# Patient Record
Sex: Female | Born: 1975 | Race: White | Hispanic: No | Marital: Married | Smoking: Never smoker
Health system: Southern US, Community
[De-identification: ages and names within clinical notes are randomized; demographics above are authoritative.]

## PROBLEM LIST (undated history)

## (undated) DIAGNOSIS — E039 Hypothyroidism, unspecified: Secondary | ICD-10-CM

## (undated) DIAGNOSIS — M419 Scoliosis, unspecified: Secondary | ICD-10-CM

## (undated) DIAGNOSIS — F419 Anxiety disorder, unspecified: Secondary | ICD-10-CM

## (undated) HISTORY — DX: Hypothyroidism, unspecified: E03.9

## (undated) HISTORY — DX: Scoliosis, unspecified: M41.9

## (undated) HISTORY — DX: Anxiety disorder, unspecified: F41.9

---

## 1999-08-23 ENCOUNTER — Other Ambulatory Visit: Admission: RE | Admit: 1999-08-23 | Discharge: 1999-08-23 | Payer: Self-pay | Admitting: Gynecology

## 1999-08-23 ENCOUNTER — Encounter (INDEPENDENT_AMBULATORY_CARE_PROVIDER_SITE_OTHER): Payer: Self-pay | Admitting: Specialist

## 2001-09-03 ENCOUNTER — Other Ambulatory Visit: Admission: RE | Admit: 2001-09-03 | Discharge: 2001-09-03 | Payer: Self-pay | Admitting: Gynecology

## 2001-12-23 ENCOUNTER — Other Ambulatory Visit: Admission: RE | Admit: 2001-12-23 | Discharge: 2001-12-23 | Payer: Self-pay | Admitting: Gynecology

## 2002-08-13 ENCOUNTER — Other Ambulatory Visit: Admission: RE | Admit: 2002-08-13 | Discharge: 2002-08-13 | Payer: Self-pay | Admitting: Gynecology

## 2003-01-07 ENCOUNTER — Other Ambulatory Visit: Admission: RE | Admit: 2003-01-07 | Discharge: 2003-01-07 | Payer: Self-pay | Admitting: Gynecology

## 2003-09-01 ENCOUNTER — Other Ambulatory Visit: Admission: RE | Admit: 2003-09-01 | Discharge: 2003-09-01 | Payer: Self-pay | Admitting: Gynecology

## 2004-09-23 ENCOUNTER — Other Ambulatory Visit: Admission: RE | Admit: 2004-09-23 | Discharge: 2004-09-23 | Payer: Self-pay | Admitting: Gynecology

## 2005-10-23 ENCOUNTER — Other Ambulatory Visit: Admission: RE | Admit: 2005-10-23 | Discharge: 2005-10-23 | Payer: Self-pay | Admitting: Gynecology

## 2006-05-25 ENCOUNTER — Inpatient Hospital Stay (HOSPITAL_COMMUNITY): Admission: AD | Admit: 2006-05-25 | Discharge: 2006-05-25 | Payer: Self-pay | Admitting: Gynecology

## 2006-11-19 ENCOUNTER — Other Ambulatory Visit: Admission: RE | Admit: 2006-11-19 | Discharge: 2006-11-19 | Payer: Self-pay | Admitting: Gynecology

## 2007-04-22 ENCOUNTER — Ambulatory Visit (HOSPITAL_COMMUNITY): Admission: RE | Admit: 2007-04-22 | Discharge: 2007-04-22 | Payer: Self-pay | Admitting: Gynecology

## 2010-08-07 ENCOUNTER — Encounter: Payer: Self-pay | Admitting: Gynecology

## 2011-04-12 ENCOUNTER — Emergency Department (HOSPITAL_COMMUNITY)
Admission: EM | Admit: 2011-04-12 | Discharge: 2011-04-13 | Disposition: A | Discharge status: Home / Self Care | Payer: Managed Care, Other (non HMO) | Attending: Emergency Medicine | Admitting: Emergency Medicine

## 2011-04-12 DIAGNOSIS — IMO0002 Reserved for concepts with insufficient information to code with codable children: Secondary | ICD-10-CM | POA: Insufficient documentation

## 2011-04-12 DIAGNOSIS — H5710 Ocular pain, unspecified eye: Secondary | ICD-10-CM | POA: Insufficient documentation

## 2011-04-12 DIAGNOSIS — H11419 Vascular abnormalities of conjunctiva, unspecified eye: Secondary | ICD-10-CM | POA: Insufficient documentation

## 2011-04-12 DIAGNOSIS — Z79899 Other long term (current) drug therapy: Secondary | ICD-10-CM | POA: Insufficient documentation

## 2011-04-12 DIAGNOSIS — S058X9A Other injuries of unspecified eye and orbit, initial encounter: Secondary | ICD-10-CM | POA: Insufficient documentation

## 2014-01-27 ENCOUNTER — Other Ambulatory Visit: Payer: Self-pay

## 2014-01-27 DIAGNOSIS — Z1231 Encounter for screening mammogram for malignant neoplasm of breast: Secondary | ICD-10-CM

## 2014-02-06 ENCOUNTER — Ambulatory Visit: Payer: Managed Care, Other (non HMO)

## 2014-02-13 ENCOUNTER — Ambulatory Visit
Admission: RE | Admit: 2014-02-13 | Discharge: 2014-02-13 | Disposition: A | Discharge status: Home / Self Care | Payer: Managed Care, Other (non HMO) | Source: Ambulatory Visit

## 2014-02-13 ENCOUNTER — Encounter (INDEPENDENT_AMBULATORY_CARE_PROVIDER_SITE_OTHER): Payer: Self-pay

## 2014-02-13 DIAGNOSIS — Z1231 Encounter for screening mammogram for malignant neoplasm of breast: Secondary | ICD-10-CM

## 2014-03-06 ENCOUNTER — Other Ambulatory Visit: Payer: Self-pay | Admitting: Registered Nurse

## 2014-03-06 ENCOUNTER — Other Ambulatory Visit (HOSPITAL_COMMUNITY)
Admission: RE | Admit: 2014-03-06 | Discharge: 2014-03-06 | Disposition: A | Discharge status: Home / Self Care | Payer: Managed Care, Other (non HMO) | Source: Ambulatory Visit | Attending: Internal Medicine | Admitting: Internal Medicine

## 2014-03-06 DIAGNOSIS — Z01419 Encounter for gynecological examination (general) (routine) without abnormal findings: Secondary | ICD-10-CM | POA: Diagnosis not present

## 2014-03-12 LAB — CYTOLOGY - PAP

## 2014-05-25 ENCOUNTER — Telehealth: Payer: Self-pay | Admitting: Cardiology

## 2014-05-25 NOTE — Telephone Encounter (Signed)
[facility] (Dr Merri BrunetteWalter Pharr) for appointment with Dr Antoine PocheHochrein on 05/26/14.  [facility] for Dr Hochrein's schedule on 05/26/14 lp

## 2014-05-26 ENCOUNTER — Encounter: Payer: Self-pay | Admitting: Cardiology

## 2014-05-26 ENCOUNTER — Ambulatory Visit (INDEPENDENT_AMBULATORY_CARE_PROVIDER_SITE_OTHER): Payer: Managed Care, Other (non HMO) | Admitting: Cardiology

## 2014-05-26 VITALS — BP 104/66 | HR 61 | Ht 66.0 in | Wt 143.3 lb

## 2014-05-26 DIAGNOSIS — R002 Palpitations: Secondary | ICD-10-CM | POA: Insufficient documentation

## 2014-05-26 NOTE — Patient Instructions (Signed)
Follow up if needed

## 2014-05-26 NOTE — Progress Notes (Signed)
HPI The patient presents for evaluation of palpitations. She has no past cardiac history. She says she's been getting palpitations for about 3 weeks since she started on a brief course of steroids for hives. She denies any syncope or presyncope she feels some palpitations in her chest which she describes as isolated beats. She also describes something that feels like Octopus tentacles in her chest. These may have been daily or may go away for a while. She does not describe sustained tachycardia arrhythmias. These happen more at rest. She cannot bring them on with activity. She is active. She denies any chest pressure, neck or arm discomfort. She has no shortness of breath, PND or orthopnea. Anxiety and is currently to have therapy for management of this.  Allergies  Allergen Reactions  . Latuda [Lurasidone] Shortness Of Breath    Tingling in hands     Current Outpatient Prescriptions  Medication Sig Dispense Refill  . SYNTHROID 75 MCG tablet Take 75 mg by mouth daily.     No current facility-administered medications for this visit.    Past Medical History  Diagnosis Date  . Hypothyroid   . Anxiety     No past surgical history on file.  Family History  Problem Relation Age of Onset  . CAD Father 5870    7483  . Hypertension Mother     History   Social History  . Marital Status: Married    Spouse Name: N/A    Number of Children: 1  . Years of Education: N/A   Occupational History  . Not on file.   Social History Main Topics  . Smoking status: Never Smoker   . Smokeless tobacco: Not on file  . Alcohol Use: Not on file  . Drug Use: Not on file  . Sexual Activity: Not on file   Other Topics Concern  . Not on file   Social History Narrative   Lives at home with husband and son.    ROS:As stated in the HPI and negative for all other systems.  PHYSICAL EXAM BP 104/66 mmHg  Pulse 61  Ht 5\' 6"  (1.676 m)  Wt 143 lb 4.8 oz (65 kg)  BMI 23.14 kg/m2  GENERAL:  Well  appearing HEENT:  Pupils equal round and reactive, fundi not visualized, oral mucosa unremarkable NECK:  No jugular venous distention, waveform within normal limits, carotid upstroke brisk and symmetric, no bruits, no thyromegaly LYMPHATICS:  No cervical, inguinal adenopathy LUNGS:  Clear to auscultation bilaterally BACK:  No CVA tenderness CHEST:  Unremarkable HEART:  PMI not displaced or sustained,S1 and S2 within normal limits, no S3, no S4, no clicks, no rubs, no murmurs ABD:  Flat, positive bowel sounds normal in frequency in pitch, no bruits, no rebound, no guarding, no midline pulsatile mass, no hepatomegaly, no splenomegaly EXT:  2 plus pulses throughout, no edema, no cyanosis no clubbing SKIN:  No rashes no nodules NEURO:  Cranial nerves II through XII grossly intact, motor grossly intact throughout PSYCH:  Cognitively intact, oriented to person place and time  EKG:  Sinus rhythm, rate 61, axis within normal limits, intervals within normal limits, no acute ST-T wave changes. 05/26/2014   ASSESSMENT AND PLAN  PALPITATIONS:  She has a normal physical exam and EKG.  I do not suspect structural heart disease.  She is likely having PACs or PVCs.  We had a discussion about the nature of these. At this point she does not want medical therapy. We discussed beta  blockers or calcium channel blockers. She will consider these if she has any increasing symptoms. We did discuss possible contribution of caffeine although she says she has these before she wakes up in the morning and has her coffee.  Of note her thyroid profile was recently been normal.

## 2015-05-19 ENCOUNTER — Other Ambulatory Visit: Payer: Self-pay

## 2015-05-19 DIAGNOSIS — Z1231 Encounter for screening mammogram for malignant neoplasm of breast: Secondary | ICD-10-CM

## 2015-06-09 ENCOUNTER — Ambulatory Visit
Admission: RE | Admit: 2015-06-09 | Discharge: 2015-06-09 | Disposition: A | Discharge status: Home / Self Care | Payer: Managed Care, Other (non HMO) | Source: Ambulatory Visit

## 2015-06-09 DIAGNOSIS — Z1231 Encounter for screening mammogram for malignant neoplasm of breast: Secondary | ICD-10-CM

## 2016-07-28 ENCOUNTER — Other Ambulatory Visit: Payer: Self-pay | Admitting: Registered Nurse

## 2016-07-28 DIAGNOSIS — Z1231 Encounter for screening mammogram for malignant neoplasm of breast: Secondary | ICD-10-CM

## 2016-08-15 ENCOUNTER — Ambulatory Visit: Payer: Managed Care, Other (non HMO)

## 2016-08-25 ENCOUNTER — Ambulatory Visit
Admission: RE | Admit: 2016-08-25 | Discharge: 2016-08-25 | Disposition: A | Discharge status: Home / Self Care | Payer: Managed Care, Other (non HMO) | Source: Ambulatory Visit | Attending: Registered Nurse | Admitting: Registered Nurse

## 2016-08-25 DIAGNOSIS — Z1231 Encounter for screening mammogram for malignant neoplasm of breast: Secondary | ICD-10-CM

## 2017-11-26 ENCOUNTER — Other Ambulatory Visit: Payer: Self-pay | Admitting: Registered Nurse

## 2017-11-26 DIAGNOSIS — Z1231 Encounter for screening mammogram for malignant neoplasm of breast: Secondary | ICD-10-CM

## 2017-12-19 ENCOUNTER — Ambulatory Visit: Payer: Managed Care, Other (non HMO)

## 2017-12-26 ENCOUNTER — Ambulatory Visit
Admission: RE | Admit: 2017-12-26 | Discharge: 2017-12-26 | Disposition: A | Discharge status: Home / Self Care | Payer: Managed Care, Other (non HMO) | Source: Ambulatory Visit | Attending: Registered Nurse | Admitting: Registered Nurse

## 2017-12-26 DIAGNOSIS — Z1231 Encounter for screening mammogram for malignant neoplasm of breast: Secondary | ICD-10-CM

## 2018-05-02 DIAGNOSIS — F429 Obsessive-compulsive disorder, unspecified: Secondary | ICD-10-CM

## 2018-05-07 ENCOUNTER — Ambulatory Visit: Payer: Self-pay | Admitting: Psychiatry

## 2018-05-16 ENCOUNTER — Other Ambulatory Visit: Payer: Self-pay

## 2018-05-16 MED ORDER — DULOXETINE HCL 60 MG PO CPEP: 60.0000 mg | ORAL_CAPSULE | Freq: Every day | ORAL | 0 refills | Status: DC

## 2018-05-30 ENCOUNTER — Ambulatory Visit (INDEPENDENT_AMBULATORY_CARE_PROVIDER_SITE_OTHER): Payer: 59 | Admitting: Psychiatry

## 2018-05-30 DIAGNOSIS — F429 Obsessive-compulsive disorder, unspecified: Secondary | ICD-10-CM

## 2018-05-30 DIAGNOSIS — Z8659 Personal history of other mental and behavioral disorders: Secondary | ICD-10-CM

## 2018-05-30 DIAGNOSIS — F411 Generalized anxiety disorder: Secondary | ICD-10-CM

## 2018-05-30 NOTE — Progress Notes (Signed)
[facility]  Patient ID: Amanda Gilmore,  MRN: [id number]  PCP: Merri BrunettePharr, Walter, MD  Date of Evaluation: 05/30/2018 Time spent:20 minutes  Chief Complaint:   HISTORY/CURRENT STATUS: HPI 42 year old white female last seen in September of this year.  His diagnosis of anxiety, OCD.  Has past history of bipolar disorder and generalized anxiety disorder.  At her most recent visit her depression was about the same.  Complaining of problems with memory.  OCD is about the same.  Cymbalta was increased to 60 mg a day Currently doing well. No depression or manic sx. Still with OCD-takes pictures of things to prove to self that she did them.   Individual Medical History/ Review of Systems: Changes? :No   Allergies: Latuda [lurasidone]  Current Medications:  Current Outpatient Medications:  .  DULoxetine (CYMBALTA) 60 MG capsule, Take 1 capsule (60 mg total) by mouth daily., Disp: 30 capsule, Rfl: 0 .  SYNTHROID 75 MCG tablet, Take 75 mg by mouth daily., Disp: , Rfl:  Medication Side Effects: none  Family Medical/ Social History: Changes? No  MENTAL HEALTH EXAM:  There were no vitals taken for this visit.There is no height or weight on file to calculate BMI.  General Appearance: Casual  Eye Contact:  Good  Speech:  Normal Rate  Volume:  Normal  Mood:  Euthymic  Affect:  Appropriate  Thought Process:  Linear  Orientation:  Full (Time, Place, and Person)  Thought Content: Logical   Suicidal Thoughts:  No  Homicidal Thoughts:  No  Memory:  WNL  Judgement:  Good  Insight:  Good  Psychomotor Activity:  Normal  Concentration:  Concentration: Good  Recall:  Good  Fund of Knowledge: Good  Language: Good  Assets:  Social Support  ADL's:  Intact  Cognition: WNL  Prognosis:  Good    DIAGNOSES:    ICD-10-CM   1. Anxiety state F41.1   2. Obsessive-compulsive disorder, unspecified type F42.9   3. History of bipolar disorder Z86.59     Receiving Psychotherapy: No     RECOMMENDATIONS: No change continue Cymbalta 60 mg a day.  If her memory starts to bother her again she will contact her family physician.   Anne Fulay Agam Tuohy, PA-C

## 2018-05-31 ENCOUNTER — Other Ambulatory Visit: Payer: Self-pay

## 2018-05-31 MED ORDER — DULOXETINE HCL 60 MG PO CPEP: 60.0000 mg | ORAL_CAPSULE | Freq: Every day | ORAL | 0 refills | Status: DC

## 2018-08-26 ENCOUNTER — Ambulatory Visit: Payer: 59 | Admitting: Psychiatry

## 2018-09-03 ENCOUNTER — Ambulatory Visit: Payer: 59 | Admitting: Psychiatry

## 2019-01-03 ENCOUNTER — Telehealth: Payer: Self-pay

## 2019-01-03 NOTE — Telephone Encounter (Signed)
    COVID-19 Pre-Screening Questions:  . In the past 7 to 10 days have you had a cough,  shortness of breath, headache, congestion, fever (100 or greater) body aches, chills, sore throat, or sudden loss of taste or sense of smell? No . Have you been around anyone with known Covid 19. No . Have you been around anyone who is awaiting Covid 19 test results in the past 7 to 10 days? No . Have you been around anyone who has been exposed to Covid 19, or has mentioned symptoms of Covid 19 within the past 7 to 10 days? No  If you have any concerns/questions about symptoms patients report during screening (either on the phone or at threshold). Contact the provider seeing the patient or DOD for further guidance.  If neither are available contact a member of the leadership team.  Pt made aware to wear a mask and no visitors. Pt voiced understanding.

## 2019-01-07 ENCOUNTER — Encounter: Payer: Self-pay | Admitting: Cardiology

## 2019-01-07 ENCOUNTER — Other Ambulatory Visit: Payer: Self-pay

## 2019-01-07 ENCOUNTER — Ambulatory Visit (INDEPENDENT_AMBULATORY_CARE_PROVIDER_SITE_OTHER): Payer: Managed Care, Other (non HMO) | Admitting: Cardiology

## 2019-01-07 VITALS — BP 109/77 | HR 70 | Ht 67.0 in | Wt 177.6 lb

## 2019-01-07 DIAGNOSIS — Z8249 Family history of ischemic heart disease and other diseases of the circulatory system: Secondary | ICD-10-CM

## 2019-01-07 DIAGNOSIS — R002 Palpitations: Secondary | ICD-10-CM

## 2019-01-07 DIAGNOSIS — Z7189 Other specified counseling: Secondary | ICD-10-CM | POA: Diagnosis not present

## 2019-01-07 NOTE — Patient Instructions (Addendum)

## 2019-01-07 NOTE — Progress Notes (Signed)
Cardiology Office Note:    Date:  01/07/2019   ID:  Amanda Gilmore, DOB 1975/12/13, MRN [id number]  PCP:  Deland Pretty, MD  Cardiologist:  Buford Dresser, MD PhD (previously Dr. Percival Spanish in 2015)  Referring MD: Deland Pretty, MD   CC: palpitations  History of Present Illness:    Amanda Gilmore is a 43 y.o. female with a hx of palpitations who is seen as a new consult at the request of Deland Pretty, MD for the evaluation and management of palpitations.  Tachycardia/palpitations: -Initial onset: first noticed about 10 years ago, has been intermittent but for 3 days starting 12/26/18 felt like it was constant. She was doing work outside when it started on a Friday, and it continued throughout the weekend. It has since stopped. She saw Dr. Shelia Media several days after, notes reviewed.  -Frequency/Duration: comes and goes, most recent episode lasted constantly for 3 days. Was doing yard work/mulching at the time. Lasted all day and all night. Has every few months, not predictable. -Associated symptoms: no chest pain, shortness of breath, nausea, and diaphoresis. Felt like heart was flip-flopping like a fish, felt it beating in her throat. Denies chest pain to me. -Aggravating/alleviating factors: not better with rest, possibly triggered by yard work -Syncope/near syncope: none -Prior cardiac history: none -Prior ECG: NSR -Prior workup: none -Prior treatment: none -Possible medication interactions: on cymbalta and synthroid. No recent med dose changes -Caffeine: 1-2 cups of coffee/day (less now that she works from home) -Alcohol: 2 glasses of wine/week -Tobacco: none -OTC supplements: occ multivitamin, no others -Comorbidities: thyroid disease (has been stable), anxiety (well controlled) -Exercise level: not as active as she wants to be, has gained 40 lbs in 4 years. -Labs: TSH, kidney function/electrolytes, CBC reviewed. -Cardiac ROS: no chest pain, no shortness of breath, no PND, no  orthopnea, no LE edema. -Family history: Pat grandfather and father died of MI (dad was 43). Mat gpa had MI. No clear strokes. Mother had an "episode" many years ago, unclear what it was, ?stroke.  Past Medical History:  Diagnosis Date  . Anxiety   . Hypothyroid     History reviewed. No pertinent surgical history.  Current Medications: Current Outpatient Medications on File Prior to Visit  Medication Sig  . DULoxetine (CYMBALTA) 60 MG capsule Take 1 capsule (60 mg total) by mouth daily.  Marland Kitchen SYNTHROID 75 MCG tablet Take 75 mg by mouth daily.   No current facility-administered medications on file prior to visit.      Allergies:   Latuda [lurasidone]   Social History   Socioeconomic History  . Marital status: Married    Spouse name: Not on file  . Number of children: 1  . Years of education: Not on file  . Highest education level: Not on file  Occupational History  . Not on file  Social Needs  . Financial resource strain: Not on file  . Food insecurity    Worry: Not on file    Inability: Not on file  . Transportation needs    Medical: Not on file    Non-medical: Not on file  Tobacco Use  . Smoking status: Never Smoker  Substance and Sexual Activity  . Alcohol use: Not on file  . Drug use: Not on file  . Sexual activity: Not on file  Lifestyle  . Physical activity    Days per week: Not on file    Minutes per session: Not on file  . Stress: Not on file  Relationships  . Social Musicianconnections    Talks on phone: Not on file    Gets together: Not on file    Attends religious service: Not on file    Active member of club or organization: Not on file    Attends meetings of clubs or organizations: Not on file    Relationship status: Not on file  Other Topics Concern  . Not on file  Social History Narrative   Lives at home with husband and son.     Family History: The patient's family history includes CAD (age of onset: 7270) in her father; Hypertension in her mother.   ROS:   Please see the history of present illness.  Additional pertinent ROS:  Constitutional: Negative for chills, fever, night sweats, unintentional weight loss  HENT: Negative for ear pain and hearing loss.   Eyes: Negative for loss of vision and eye pain.  Respiratory: Negative for cough, sputum, shortness of breath, wheezing.   Cardiovascular: See HPI. Gastrointestinal: Negative for abdominal pain, melena, and hematochezia.  Genitourinary: Negative for dysuria and hematuria.  Musculoskeletal: Negative for falls and myalgias.  Skin: Negative for itching and rash.  Neurological: Negative for focal weakness, focal sensory changes and loss of consciousness.  Endo/Heme/Allergies: Does not bruise/bleed easily.   EKGs/Labs/Other Studies Reviewed:    The following studies were reviewed today: Notes from Dr. Carolee RotaPharr's office  EKG:  EKG is personally reviewed.  The ekg ordered today demonstrates NSR  Recent Labs: No results found for requested labs within last 8760 hours.  Recent Lipid Panel No results found for: CHOL, TRIG, HDL, CHOLHDL, VLDL, LDLCALC, LDLDIRECT  Physical Exam:    VS:  BP 109/77   Pulse 70   Ht 5\' 7"  (1.702 m)   Wt 177 lb 9.6 oz (80.6 kg)   SpO2 97%   BMI 27.82 kg/m     Wt Readings from Last 3 Encounters:  01/07/19 177 lb 9.6 oz (80.6 kg)  05/26/14 143 lb 4.8 oz (65 kg)     GEN: Well nourished, well developed in no acute distress HEENT: Normal NECK: No JVD; No carotid bruits LYMPHATICS: No lymphadenopathy CARDIAC: regular rhythm, normal S1 and S2, no murmurs, rubs, gallops. Radial and DP pulses 2+ bilaterally. RESPIRATORY:  Clear to auscultation without rales, wheezing or rhonchi  ABDOMEN: Soft, non-tender, non-distended MUSCULOSKELETAL:  No edema; No deformity  SKIN: Warm and dry NEUROLOGIC:  Alert and oriented x 3 PSYCHIATRIC:  Normal affect   ASSESSMENT:    1. Heart palpitations   2. Family history of heart disease   3. Cardiac risk counseling     PLAN:    Palpitations: discussed potential etiologies at length, focused on arrhythmias/conduction issues. Also discussed potential role of structural and coronary disease, though her symptoms are not typical for this.  -given the infrequency of her symptoms and the unclear triggers, it may be difficult to catch an episode with a temporary monitor. She has no high risk features warranting an implantable device -if she has another sustained episode, she will call the office and we will bring her in for a nurse visit for an ECG -if she has several short events but increasing frequency, we will order an event monitor to evaluate her rhythm. -if she does not have frequent or long lasting events, it may be difficult to capture. -we will follow up in 1 year if her symptoms do not progress, or I would be happy to see her sooner with any other concerns.  Cardiac  risk counseling and prevention recommendations: At follow up, would like to discuss prevention in more depth given her family history of heart disease. -recommend heart healthy/Mediterranean diet, with whole grains, fruits, vegetable, fish, lean meats, nuts, and olive oil. Limit salt. -recommend moderate walking, 3-5 times/week for 30-50 minutes each session. Aim for at least 150 minutes.week. Goal should be pace of 3 miles/hours, or walking 1.5 miles in 30 minutes -recommend avoidance of tobacco products. Avoid excess alcohol. -ASCVD risk score: The 10-year ASCVD risk score Denman George([place]off [place] Montez HagemanJr., et al., 2013) is: 0.3%   Values used to calculate the score:     Age: [age] years     Sex: Female     Is Non-Hispanic African American: No     Diabetic: No     Tobacco smoker: No     Systolic Blood Pressure: 109 mmHg     Is BP treated: No     HDL Cholesterol: 62 mg/dL     Total Cholesterol: 143 mg/dL    Plan for follow up: 1 year or sooner PRN based on her symptoms  Medication Adjustments/Labs and Tests Ordered: Current medicines are reviewed at  length with the patient today.  Concerns regarding medicines are outlined above.  Orders Placed This Encounter  Procedures  . EKG 12-Lead   No orders of the defined types were placed in this encounter.   Patient Instructions  Medication Instructions:  Your Physician recommend you continue on your current medication as directed.    If you need a refill on your cardiac medications before your next appointment, please call your pharmacy.   Lab work: None  Testing/Procedures: None  Follow-Up: At [facility], you and your health needs are our priority.  As part of our continuing mission to provide you with exceptional heart care, we have created designated Provider Care Teams.  These Care Teams include your primary Cardiologist (physician) and Advanced Practice Providers (APPs -  Physician Assistants and Nurse Practitioners) who all work together to provide you with the care you need, when you need it. You will need a follow up appointment in 1 years.  Please call our office 2 months in advance to schedule this appointment.  You may see Dr. Cristal Deerhristopher or one of the following Advanced Practice Providers on your designated Care Team:   Theodore DemarkRhonda Barrett, PA-C . Joni ReiningKathryn Lawrence, DNP, ANP        Signed, Jodelle RedBridgette Nguyen Butler, MD PhD 01/07/2019 2:50 PM    [facility]

## 2019-03-04 ENCOUNTER — Other Ambulatory Visit: Payer: Self-pay

## 2019-03-04 ENCOUNTER — Ambulatory Visit (INDEPENDENT_AMBULATORY_CARE_PROVIDER_SITE_OTHER): Payer: 59 | Admitting: Psychiatry

## 2019-03-04 ENCOUNTER — Encounter

## 2019-03-04 ENCOUNTER — Encounter: Payer: Self-pay | Admitting: Psychiatry

## 2019-03-04 VITALS — Ht 67.0 in | Wt 173.0 lb

## 2019-03-04 DIAGNOSIS — F411 Generalized anxiety disorder: Secondary | ICD-10-CM | POA: Diagnosis not present

## 2019-03-04 MED ORDER — BUSPIRONE HCL 15 MG PO TABS: ORAL_TABLET | ORAL | 1 refills | Status: DC

## 2019-03-04 MED ORDER — DULOXETINE HCL 20 MG PO CSDR: 40.0000 mg | DELAYED_RELEASE_CAPSULE | Freq: Every morning | ORAL | 1 refills | Status: DC

## 2019-03-04 NOTE — Progress Notes (Signed)
Amanda Gilmore [id number] Aug 19, 1975 43 y.o.  Subjective:   Patient ID:  Amanda MarkMarla M Gilmore is a 43 y.o. (DOB Aug 19, 1975) female.  Chief Complaint:  Chief Complaint  Patient presents with  . Anxiety    HPI Amanda Gilmore presents to the office today for follow-up of anxiety.  Patient was previously seen by Anne Fulay Shugart, PA.  She reports that she feels "flat" with current medication and is interested in making a change in medication. Pt reports increased anxiety in response to pandemic. Notices increased worry and rumination. Denies panic attacks. Denies any physical s/s with anxiety. Had episode of heart palpitations a few months ago and both she and cardiologist did not think it palpitations were related to anxiety.   Denies persistent depressed mood. Occ situational sadness. She reports that she is getting less sleep and has difficulty falling asleep. Estimates sleeping 5 hours a night. She reports that appetite has been good. Reports some weight gain with pandemic. Energy and motivation have been lower. Notices procrastination at work. She reports poor concentration and is more easily distracted. Denies anhedonia. Denies SI.   Mother lives with her. Son is going into 4th grade. Has been working from home and staying mostly at home. 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   Reports that SNRI's seem to have been better tolerated for her.   Past Psychiatric Medication Trials: Latuda- Adverse reaction- tingling in hands and SOB Cymbalta- helped with anxiety. "Flat" feeling/affective dulling Zoloft- heart palpitations Paxil Pristiq Viibryd Trintellix Lamictal   Review of Systems:  Review of Systems  Constitutional: Positive for unexpected weight change.  Cardiovascular: Negative for palpitations.  Musculoskeletal: Negative for gait problem.  Neurological: Negative for tremors.  Psychiatric/Behavioral:       Please refer to HPI    Medications: I have reviewed the patient's current  medications.  Current Outpatient Medications  Medication Sig Dispense Refill  . levothyroxine (SYNTHROID) 88 MCG tablet Take 88 mg by mouth daily.     . Multiple Vitamin (MULTIVITAMIN) tablet Take 1 tablet by mouth daily.    . Probiotic Product (PROBIOTIC PO) Take by mouth.    . busPIRone (BUSPAR) 15 MG tablet Take 1/3 tablet p.o. twice daily for 1 week, then take 2/3 tablet p.o. twice daily for 1 week, then take 1 tablet p.o. twice daily 60 tablet 1  . DULoxetine HCl 20 MG CSDR Take 40 mg by mouth every morning. 30 capsule 1   No current facility-administered medications for this visit.     Medication Side Effects: None  Allergies:  Allergies  Allergen Reactions  . Latuda [Lurasidone] Shortness Of Breath    Tingling in hands     Past Medical History:  Diagnosis Date  . Anxiety   . Hypothyroid     Family History  Problem Relation Age of Onset  . CAD Father 3970       6683  . Hypertension Mother     Social History   Socioeconomic History  . Marital status: Married    Spouse name: Not on file  . Number of children: 1  . Years of education: Not on file  . Highest education level: Not on file  Occupational History  . Not on file  Social Needs  . Financial resource strain: Not on file  . Food insecurity    Worry: Not on file    Inability: Not on file  . Transportation needs    Medical: Not on file    Non-medical: Not  on file  Tobacco Use  . Smoking status: Never Smoker  . Smokeless tobacco: Never Used  Substance and Sexual Activity  . Alcohol use: Not on file  . Drug use: Not on file  . Sexual activity: Not on file  Lifestyle  . Physical activity    Days per week: Not on file    Minutes per session: Not on file  . Stress: Not on file  Relationships  . Social Herbalist on phone: Not on file    Gets together: Not on file    Attends religious service: Not on file    Active member of club or organization: Not on file    Attends meetings of clubs  or organizations: Not on file    Relationship status: Not on file  . Intimate partner violence    Fear of current or ex partner: Not on file    Emotionally abused: Not on file    Physically abused: Not on file    Forced sexual activity: Not on file  Other Topics Concern  . Not on file  Social History Narrative   Lives at home with husband and son.    Past Medical History, Surgical history, Social history, and Family history were reviewed and updated as appropriate.   Please see review of systems for further details on the patient's review from today.   Objective:   Physical Exam:  Ht 5\' 7"  (1.702 m)   Wt 173 lb (78.5 kg)   BMI 27.10 kg/m   Physical Exam Constitutional:      General: She is not in acute distress.    Appearance: She is well-developed.  Musculoskeletal:        General: No deformity.  Neurological:     Mental Status: She is alert and oriented to person, place, and time.     Coordination: Coordination normal.  Psychiatric:        Attention and Perception: Attention and perception normal. She does not perceive auditory or visual hallucinations.        Mood and Affect: Mood is anxious. Mood is not depressed. Affect is not labile, blunt, angry or inappropriate.        Speech: Speech normal.        Behavior: Behavior normal.        Thought Content: Thought content normal. Thought content does not include homicidal or suicidal ideation. Thought content does not include homicidal or suicidal plan.        Cognition and Memory: Cognition and memory normal.        Judgment: Judgment normal.     Comments: Insight intact. No delusions.      Lab Review:  No results found for: NA, K, CL, CO2, GLUCOSE, BUN, CREATININE, CALCIUM, PROT, ALBUMIN, AST, ALT, ALKPHOS, BILITOT, GFRNONAA, GFRAA  No results found for: WBC, RBC, HGB, HCT, PLT, MCV, MCH, MCHC, RDW, LYMPHSABS, MONOABS, EOSABS, BASOSABS  No results found for: POCLITH, LITHIUM   No results found for: PHENYTOIN,  PHENOBARB, VALPROATE, CBMZ   .res Assessment: Plan:   Discussed potential benefits, risks, and side effects of BuSpar for anxiety.  Patient agrees to trial of BuSpar. Discussed option of either continuing Cymbalta 60 mg daily while starting BuSpar or reducing dose of Cymbalta.  Patient reports that she would prefer to start reducing dose of Cymbalta.  She reports that she has had some significant discontinuation if she is late or misses a dose of Cymbalta.  Discussed therefore decreasing Cymbalta to  40 mg daily until patient returns to office to minimize discontinuation signs and symptoms and also to determine if affect of dulling improves with lower dose. Patient to follow-up with this provider in 6 weeks or sooner if clinically indicated. Patient advised to contact office with any questions, adverse effects, or acute worsening in signs and symptoms.  Amanda Gilmore was seen today for anxiety.  Diagnoses and all orders for this visit:  Generalized anxiety disorder -     DULoxetine HCl 20 MG CSDR; Take 40 mg by mouth every morning. -     busPIRone (BUSPAR) 15 MG tablet; Take 1/3 tablet p.o. twice daily for 1 week, then take 2/3 tablet p.o. twice daily for 1 week, then take 1 tablet p.o. twice daily     Please see After Visit Summary for patient specific instructions.  Future Appointments  Date Time Provider Department Center  04/15/2019  8:30 AM Corie Chiquitoarter, Raevin Wierenga, PMHNP [facility]-CP None    No orders of the defined types were placed in this encounter.   -------------------------------

## 2019-03-04 NOTE — Patient Instructions (Signed)
-  Decrease Cymbalta to 40 mg daily. (Likely pharmacy will dispense 20 mg capsule and take 2 of the 20 mg capsules in the morning.  -Start BuSpar 15 mg 1/3 tablet twice daily for 1 week, then increase to 2/3 tablet twice daily for 1 week, then increase to 1 tablet twice daily for anxiety.  Call with any questions or issues.

## 2019-03-17 ENCOUNTER — Other Ambulatory Visit: Payer: Self-pay | Admitting: Psychiatry

## 2019-03-17 DIAGNOSIS — F411 Generalized anxiety disorder: Secondary | ICD-10-CM

## 2019-04-01 ENCOUNTER — Other Ambulatory Visit: Payer: Self-pay | Admitting: Psychiatry

## 2019-04-01 DIAGNOSIS — F411 Generalized anxiety disorder: Secondary | ICD-10-CM

## 2019-04-15 ENCOUNTER — Ambulatory Visit (INDEPENDENT_AMBULATORY_CARE_PROVIDER_SITE_OTHER): Payer: 59 | Admitting: Psychiatry

## 2019-04-15 ENCOUNTER — Other Ambulatory Visit: Payer: Self-pay

## 2019-04-15 ENCOUNTER — Encounter: Payer: Self-pay | Admitting: Psychiatry

## 2019-04-15 DIAGNOSIS — F32A Depression, unspecified: Secondary | ICD-10-CM

## 2019-04-15 DIAGNOSIS — F411 Generalized anxiety disorder: Secondary | ICD-10-CM

## 2019-04-15 DIAGNOSIS — F329 Major depressive disorder, single episode, unspecified: Secondary | ICD-10-CM | POA: Diagnosis not present
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
## 2019-05-02 ENCOUNTER — Observation Stay (HOSPITAL_COMMUNITY): Payer: Managed Care, Other (non HMO) | Admitting: Registered Nurse

## 2019-05-02 ENCOUNTER — Other Ambulatory Visit: Payer: Self-pay | Admitting: Internal Medicine

## 2019-05-02 ENCOUNTER — Encounter (HOSPITAL_COMMUNITY): Payer: Self-pay | Admitting: Emergency Medicine

## 2019-05-02 ENCOUNTER — Ambulatory Visit
Admission: RE | Admit: 2019-05-02 | Discharge: 2019-05-02 | Disposition: A | Discharge status: Home / Self Care | Payer: Managed Care, Other (non HMO) | Source: Ambulatory Visit | Attending: Internal Medicine | Admitting: Internal Medicine

## 2019-05-02 ENCOUNTER — Encounter (HOSPITAL_COMMUNITY)
Admission: EM | Disposition: A | Discharge status: Home / Self Care | Payer: Self-pay | Source: Home / Self Care | Attending: Emergency Medicine

## 2019-05-02 ENCOUNTER — Observation Stay (HOSPITAL_COMMUNITY)
Admission: EM | Admit: 2019-05-02 | Discharge: 2019-05-03 | Disposition: A | Discharge status: Home / Self Care | Payer: Managed Care, Other (non HMO) | Attending: General Surgery | Admitting: General Surgery

## 2019-05-02 ENCOUNTER — Other Ambulatory Visit: Payer: Self-pay

## 2019-05-02 DIAGNOSIS — Z79890 Hormone replacement therapy (postmenopausal): Secondary | ICD-10-CM | POA: Insufficient documentation

## 2019-05-02 DIAGNOSIS — Z79899 Other long term (current) drug therapy: Secondary | ICD-10-CM | POA: Diagnosis not present

## 2019-05-02 DIAGNOSIS — Z8249 Family history of ischemic heart disease and other diseases of the circulatory system: Secondary | ICD-10-CM | POA: Diagnosis not present

## 2019-05-02 DIAGNOSIS — R1031 Right lower quadrant pain: Secondary | ICD-10-CM

## 2019-05-02 DIAGNOSIS — N809 Endometriosis, unspecified: Secondary | ICD-10-CM | POA: Insufficient documentation

## 2019-05-02 DIAGNOSIS — Z9049 Acquired absence of other specified parts of digestive tract: Secondary | ICD-10-CM | POA: Diagnosis present

## 2019-05-02 DIAGNOSIS — E039 Hypothyroidism, unspecified: Secondary | ICD-10-CM | POA: Insufficient documentation

## 2019-05-02 DIAGNOSIS — F429 Obsessive-compulsive disorder, unspecified: Secondary | ICD-10-CM | POA: Insufficient documentation

## 2019-05-02 DIAGNOSIS — F419 Anxiety disorder, unspecified: Secondary | ICD-10-CM | POA: Insufficient documentation

## 2019-05-02 DIAGNOSIS — K3580 Unspecified acute appendicitis: Principal | ICD-10-CM | POA: Diagnosis present

## 2019-05-02 DIAGNOSIS — Z20828 Contact with and (suspected) exposure to other viral communicable diseases: Secondary | ICD-10-CM | POA: Insufficient documentation

## 2019-05-02 HISTORY — PX: LAPAROSCOPIC APPENDECTOMY: SHX408

## 2019-05-02 LAB — URINALYSIS, ROUTINE W REFLEX MICROSCOPIC
Bilirubin Urine: NEGATIVE
Glucose, UA: NEGATIVE mg/dL
Hgb urine dipstick: NEGATIVE
Ketones, ur: 20 mg/dL — AB
Leukocytes,Ua: NEGATIVE
Nitrite: NEGATIVE
Protein, ur: NEGATIVE mg/dL
Specific Gravity, Urine: 1.004 — ABNORMAL LOW (ref 1.005–1.030)
pH: 6 (ref 5.0–8.0)

## 2019-05-02 LAB — COMPREHENSIVE METABOLIC PANEL
ALT: 26 U/L (ref 0–44)
AST: 21 U/L (ref 15–41)
Albumin: 4.4 g/dL (ref 3.5–5.0)
Alkaline Phosphatase: 34 U/L — ABNORMAL LOW (ref 38–126)
Anion gap: 12 (ref 5–15)
BUN: 7 mg/dL (ref 6–20)
CO2: 23 mmol/L (ref 22–32)
Calcium: 9.3 mg/dL (ref 8.9–10.3)
Chloride: 102 mmol/L (ref 98–111)
Creatinine, Ser: 0.8 mg/dL (ref 0.44–1.00)
GFR calc Af Amer: >60 mL/min (ref >60)
GFR calc non Af Amer: >60 mL/min (ref >60)
Glucose, Bld: 97 mg/dL (ref 70–99)
Potassium: 4.6 mmol/L (ref 3.5–5.1)
Sodium: 137 mmol/L (ref 135–145)
Total Bilirubin: 0.8 mg/dL (ref 0.3–1.2)
Total Protein: 7.5 g/dL (ref 6.5–8.1)

## 2019-05-02 LAB — CBC
HCT: 44.5 % (ref 36.0–46.0)
Hemoglobin: 14.8 g/dL (ref 12.0–15.0)
MCH: 28.8 pg (ref 26.0–34.0)
MCHC: 33.3 g/dL (ref 30.0–36.0)
MCV: 86.7 fL (ref 80.0–100.0)
Platelets: 278 10*3/uL (ref 150–400)
RBC: 5.13 MIL/uL — ABNORMAL HIGH (ref 3.87–5.11)
RDW: 12.8 % (ref 11.5–15.5)
WBC: 14 10*3/uL — ABNORMAL HIGH (ref 4.0–10.5)
nRBC: 0 % (ref 0.0–0.2)

## 2019-05-02 LAB — I-STAT BETA HCG BLOOD, ED (MC, WL, AP ONLY): I-stat hCG, quantitative: <5 m[IU]/mL (ref <5)

## 2019-05-02 LAB — LIPASE, BLOOD: Lipase: 21 U/L (ref 11–51)

## 2019-05-02 LAB — SARS CORONAVIRUS 2 BY RT PCR (HOSPITAL ORDER, PERFORMED IN ~~LOC~~ HOSPITAL LAB): SARS Coronavirus 2: NEGATIVE

## 2019-05-02 SURGERY — APPENDECTOMY, LAPAROSCOPIC
Anesthesia: General | Site: Abdomen

## 2019-05-02 MED ORDER — PHENYLEPHRINE 40 MCG/ML (10ML) SYRINGE FOR IV PUSH (FOR BLOOD PRESSURE SUPPORT)
PREFILLED_SYRINGE | INTRAVENOUS | Status: AC
  Filled 2019-05-02: qty 10

## 2019-05-02 MED ORDER — 0.9 % SODIUM CHLORIDE (POUR BTL) OPTIME
TOPICAL | Status: DC | PRN
  Administered 2019-05-02: 1000 mL

## 2019-05-02 MED ORDER — DIPHENHYDRAMINE HCL 50 MG/ML IJ SOLN: 12.5000 mg | Freq: Once | INTRAMUSCULAR | Status: DC

## 2019-05-02 MED ORDER — KETOROLAC TROMETHAMINE 30 MG/ML IJ SOLN
INTRAMUSCULAR | Status: AC
  Filled 2019-05-02: qty 1

## 2019-05-02 MED ORDER — KETOROLAC TROMETHAMINE 15 MG/ML IJ SOLN
INTRAMUSCULAR | Status: DC | PRN
  Administered 2019-05-02: 15 mg via INTRAVENOUS

## 2019-05-02 MED ORDER — ROCURONIUM BROMIDE 10 MG/ML (PF) SYRINGE
PREFILLED_SYRINGE | INTRAVENOUS | Status: AC
  Filled 2019-05-02: qty 10

## 2019-05-02 MED ORDER — LIDOCAINE 2% (20 MG/ML) 5 ML SYRINGE
INTRAMUSCULAR | Status: DC | PRN
  Administered 2019-05-02: 100 mg via INTRAVENOUS

## 2019-05-02 MED ORDER — DEXAMETHASONE SODIUM PHOSPHATE 10 MG/ML IJ SOLN
INTRAMUSCULAR | Status: AC
  Filled 2019-05-02: qty 1

## 2019-05-02 MED ORDER — PROPOFOL 10 MG/ML IV BOLUS
INTRAVENOUS | Status: AC
  Filled 2019-05-02: qty 20

## 2019-05-02 MED ORDER — FENTANYL CITRATE (PF) 250 MCG/5ML IJ SOLN
INTRAMUSCULAR | Status: DC | PRN
  Administered 2019-05-02: 50 ug via INTRAVENOUS
  Administered 2019-05-02: 100 ug via INTRAVENOUS
  Administered 2019-05-02 (×2): 50 ug via INTRAVENOUS

## 2019-05-02 MED ORDER — SODIUM CHLORIDE 0.9 % IV SOLN
2.0000 g | INTRAVENOUS | Status: DC
  Administered 2019-05-02: 2 g via INTRAVENOUS
  Filled 2019-05-02: qty 20

## 2019-05-02 MED ORDER — LACTATED RINGERS IV BOLUS
500.0000 mL | Freq: Once | INTRAVENOUS | Status: AC
  Administered 2019-05-02: 500 mL via INTRAVENOUS

## 2019-05-02 MED ORDER — MIDAZOLAM HCL 2 MG/2ML IJ SOLN
INTRAMUSCULAR | Status: AC
  Filled 2019-05-02: qty 2

## 2019-05-02 MED ORDER — LACTATED RINGERS IV SOLN
INTRAVENOUS | Status: DC | PRN
  Administered 2019-05-02 (×2): via INTRAVENOUS

## 2019-05-02 MED ORDER — FENTANYL CITRATE (PF) 100 MCG/2ML IJ SOLN
25.0000 ug | INTRAMUSCULAR | Status: DC | PRN
  Administered 2019-05-02: 50 ug via INTRAVENOUS
  Administered 2019-05-02 (×2): 25 ug via INTRAVENOUS

## 2019-05-02 MED ORDER — ROCURONIUM BROMIDE 10 MG/ML (PF) SYRINGE
PREFILLED_SYRINGE | INTRAVENOUS | Status: DC | PRN
  Administered 2019-05-02: 50 mg via INTRAVENOUS

## 2019-05-02 MED ORDER — BUPIVACAINE-EPINEPHRINE (PF) 0.25% -1:200000 IJ SOLN
INTRAMUSCULAR | Status: AC
  Filled 2019-05-02: qty 20

## 2019-05-02 MED ORDER — PHENYLEPHRINE 40 MCG/ML (10ML) SYRINGE FOR IV PUSH (FOR BLOOD PRESSURE SUPPORT)
PREFILLED_SYRINGE | INTRAVENOUS | Status: DC | PRN
  Administered 2019-05-02 (×2): 80 ug via INTRAVENOUS

## 2019-05-02 MED ORDER — DEXAMETHASONE SODIUM PHOSPHATE 10 MG/ML IJ SOLN
INTRAMUSCULAR | Status: DC | PRN
  Administered 2019-05-02: 10 mg via INTRAVENOUS

## 2019-05-02 MED ORDER — IOPAMIDOL (ISOVUE-300) INJECTION 61%: 100.0000 mL | Freq: Once | INTRAVENOUS | Status: DC | PRN

## 2019-05-02 MED ORDER — BUPIVACAINE-EPINEPHRINE 0.25% -1:200000 IJ SOLN
INTRAMUSCULAR | Status: DC | PRN
  Administered 2019-05-02: 20 mL

## 2019-05-02 MED ORDER — MIDAZOLAM HCL 5 MG/5ML IJ SOLN
INTRAMUSCULAR | Status: DC | PRN
  Administered 2019-05-02: 2 mg via INTRAVENOUS

## 2019-05-02 MED ORDER — ONDANSETRON HCL 4 MG/2ML IJ SOLN
INTRAMUSCULAR | Status: AC
  Filled 2019-05-02: qty 2

## 2019-05-02 MED ORDER — METOCLOPRAMIDE HCL 5 MG/ML IJ SOLN: 5.0000 mg | Freq: Once | INTRAMUSCULAR | Status: DC

## 2019-05-02 MED ORDER — SUGAMMADEX SODIUM 200 MG/2ML IV SOLN
INTRAVENOUS | Status: DC | PRN
  Administered 2019-05-02: 200 mg via INTRAVENOUS

## 2019-05-02 MED ORDER — LIDOCAINE 2% (20 MG/ML) 5 ML SYRINGE
INTRAMUSCULAR | Status: AC
  Filled 2019-05-02: qty 5

## 2019-05-02 MED ORDER — SUCCINYLCHOLINE CHLORIDE 200 MG/10ML IV SOSY
PREFILLED_SYRINGE | INTRAVENOUS | Status: AC
  Filled 2019-05-02: qty 10

## 2019-05-02 MED ORDER — FENTANYL CITRATE (PF) 100 MCG/2ML IJ SOLN
INTRAMUSCULAR | Status: AC
  Filled 2019-05-02: qty 2

## 2019-05-02 MED ORDER — PROPOFOL 10 MG/ML IV BOLUS
INTRAVENOUS | Status: DC | PRN
  Administered 2019-05-02: 140 mg via INTRAVENOUS

## 2019-05-02 MED ORDER — SODIUM CHLORIDE 0.9 % IR SOLN
Status: DC | PRN
  Administered 2019-05-02: 1000 mL

## 2019-05-02 MED ORDER — SUCCINYLCHOLINE CHLORIDE 200 MG/10ML IV SOSY
PREFILLED_SYRINGE | INTRAVENOUS | Status: DC | PRN
  Administered 2019-05-02: 120 mg via INTRAVENOUS

## 2019-05-02 MED ORDER — LACTATED RINGERS IV SOLN
INTRAVENOUS | Status: DC
  Administered 2019-05-03: 01:00:00 via INTRAVENOUS

## 2019-05-02 MED ORDER — FENTANYL CITRATE (PF) 250 MCG/5ML IJ SOLN
INTRAMUSCULAR | Status: AC
  Filled 2019-05-02: qty 5

## 2019-05-02 MED ORDER — ONDANSETRON HCL 4 MG/2ML IJ SOLN
INTRAMUSCULAR | Status: DC | PRN
  Administered 2019-05-02: 4 mg via INTRAVENOUS

## 2019-05-02 MED ORDER — METRONIDAZOLE IN NACL 5-0.79 MG/ML-% IV SOLN
500.0000 mg | Freq: Three times a day (TID) | INTRAVENOUS | Status: DC
  Administered 2019-05-02: 500 mg via INTRAVENOUS
  Filled 2019-05-02: qty 100

## 2019-05-02 MED ORDER — EPHEDRINE 5 MG/ML INJ
INTRAVENOUS | Status: AC
  Filled 2019-05-02: qty 10

## 2019-05-02 SURGICAL SUPPLY — 48 items
ADH SKN CLS APL DERMABOND .7 (GAUZE/BANDAGES/DRESSINGS) ×1
APL PRP STRL LF DISP 70% ISPRP (MISCELLANEOUS) ×1
APPLIER CLIP 5 13 M/L LIGAMAX5 (MISCELLANEOUS) ×2
APR CLP MED LRG 5 ANG JAW (MISCELLANEOUS) ×1
BAG SPEC RTRVL LRG 6X4 10 (ENDOMECHANICALS) ×1
BLADE CLIPPER SURG (BLADE) IMPLANT
CANISTER SUCT 3000ML PPV (MISCELLANEOUS) ×2 IMPLANT
CHLORAPREP W/TINT 26 (MISCELLANEOUS) ×2 IMPLANT
CLIP APPLIE 5 13 M/L LIGAMAX5 (MISCELLANEOUS) IMPLANT
COVER SURGICAL LIGHT HANDLE (MISCELLANEOUS) ×2 IMPLANT
COVER WAND RF STERILE (DRAPES) ×2 IMPLANT
CUTTER FLEX LINEAR 45M (STAPLE) ×3 IMPLANT
DERMABOND ADVANCED (GAUZE/BANDAGES/DRESSINGS) ×1
DERMABOND ADVANCED .7 DNX12 (GAUZE/BANDAGES/DRESSINGS) ×1 IMPLANT
ELECT REM PT RETURN 9FT ADLT (ELECTROSURGICAL) ×2
ELECTRODE REM PT RTRN 9FT ADLT (ELECTROSURGICAL) ×1 IMPLANT
GLOVE BIO SURGEON STRL SZ7.5 (GLOVE) ×2 IMPLANT
GLOVE INDICATOR 8.0 STRL GRN (GLOVE) ×2 IMPLANT
GOWN STRL REUS W/ TWL LRG LVL3 (GOWN DISPOSABLE) ×2 IMPLANT
GOWN STRL REUS W/ TWL XL LVL3 (GOWN DISPOSABLE) ×1 IMPLANT
GOWN STRL REUS W/TWL LRG LVL3 (GOWN DISPOSABLE) ×4
GOWN STRL REUS W/TWL XL LVL3 (GOWN DISPOSABLE) ×2
KIT BASIN OR (CUSTOM PROCEDURE TRAY) ×2 IMPLANT
KIT TURNOVER KIT B (KITS) ×2 IMPLANT
NS IRRIG 1000ML POUR BTL (IV SOLUTION) ×2 IMPLANT
PAD ARMBOARD 7.5X6 YLW CONV (MISCELLANEOUS) ×4 IMPLANT
PENCIL SMOKE EVACUATOR (MISCELLANEOUS) ×2 IMPLANT
POUCH SPECIMEN RETRIEVAL 10MM (ENDOMECHANICALS) ×2 IMPLANT
RELOAD 45 VASCULAR/THIN (ENDOMECHANICALS) IMPLANT
RELOAD STAPLE 45 2.5 WHT GRN (ENDOMECHANICALS) IMPLANT
RELOAD STAPLE 45 3.5 BLU ETS (ENDOMECHANICALS) IMPLANT
RELOAD STAPLE TA45 3.5 REG BLU (ENDOMECHANICALS) ×4 IMPLANT
SCISSORS LAP 5X35 DISP (ENDOMECHANICALS) ×1 IMPLANT
SET IRRIG TUBING LAPAROSCOPIC (IRRIGATION / IRRIGATOR) ×2 IMPLANT
SET TUBE SMOKE EVAC HIGH FLOW (TUBING) ×2 IMPLANT
SHEARS HARMONIC ACE PLUS 36CM (ENDOMECHANICALS) ×3 IMPLANT
SLEEVE ENDOPATH XCEL 5M (ENDOMECHANICALS) ×2 IMPLANT
SPECIMEN JAR SMALL (MISCELLANEOUS) ×2 IMPLANT
SUT MNCRL AB 4-0 PS2 18 (SUTURE) ×2 IMPLANT
SYR CONTROL 10ML LL (SYRINGE) ×1 IMPLANT
TOWEL GREEN STERILE (TOWEL DISPOSABLE) ×2 IMPLANT
TOWEL GREEN STERILE FF (TOWEL DISPOSABLE) ×2 IMPLANT
TRAY FOLEY W/BAG SLVR 16FR (SET/KITS/TRAYS/PACK) ×2
TRAY FOLEY W/BAG SLVR 16FR ST (SET/KITS/TRAYS/PACK) ×1 IMPLANT
TRAY LAPAROSCOPIC MC (CUSTOM PROCEDURE TRAY) ×2 IMPLANT
TROCAR XCEL BLUNT TIP 100MML (ENDOMECHANICALS) ×2 IMPLANT
TROCAR XCEL NON-BLD 5MMX100MML (ENDOMECHANICALS) ×2 IMPLANT
WATER STERILE IRR 1000ML POUR (IV SOLUTION) ×2 IMPLANT

## 2019-05-02 NOTE — H&P (Signed)
CC: Mild right sided abdominal pain  HPI: Amanda Gilmore is an 43 y.o. female with no known medical hx who reports an approximate 6 to 7-day history of mild somewhat vague right lower abdominal discomfort that she thought was ovulatory pain.  She denies any fever/chills or nausea/vomiting.  Her only symptom has been this vague achy discomfort.  She has never had this before.  She reports this does not appear to radiate.  Nothing seems to make it better or worse.  She spoke with her primary care and was sent for a CT abdomen pelvis.  Outpatient CT was completed today and demonstrated an abnormally enlarged appendix extending into the deep right side of the pelvis consistent with early acute appendicitis.  It was measured at 8 to 9 mm in size.  There is no significant periappendiceal soft tissue stranding per se.  She was advised emergency department underwent work-up.  She has been afebrile.  She had a Amanda Gilmore blood cell count that returned 14.  We were asked to see  She denies any prior surgical history including even wisdom teeth.  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 Her husband is a Runner, broadcasting/film/videoteacher here in town.   Past Medical History:  Diagnosis Date   Anxiety    Hypothyroid     History reviewed. No pertinent surgical history.  Family History  Problem Relation Age of Onset   CAD Father 4870       83   Hypertension Mother     Social:  reports that she has never smoked. She has never used smokeless tobacco. No history on file for alcohol and drug.  Allergies:  Allergies  Allergen Reactions   Latuda [Lurasidone] Shortness Of Breath    Tingling in hands     Medications: I have reviewed the patient's current medications.  Results for orders placed or performed during the hospital encounter of 05/02/19 (from the past 48 hour(s))  Lipase, blood     Status: None   Collection Time: 05/02/19  4:31 PM  Result Value Ref Range   Lipase 21 11 - 51 U/L    Comment:  Performed at [facility], [address] [place], [place] [address]  Comprehensive metabolic panel     Status: Abnormal   Collection Time: 05/02/19  4:31 PM  Result Value Ref Range   Sodium 137 135 - 145 mmol/L   Potassium 4.6 3.5 - 5.1 mmol/L   Chloride 102 98 - 111 mmol/L   CO2 23 22 - 32 mmol/L   Glucose, Bld 97 70 - 99 mg/dL   BUN 7 6 - 20 mg/dL   Creatinine, Ser 6.040.80 0.44 - 1.00 mg/dL   Calcium 9.3 8.9 - 54.010.3 mg/dL   Total Protein 7.5 6.5 - 8.1 g/dL   Albumin 4.4 3.5 - 5.0 g/dL   AST 21 15 - 41 U/L   ALT 26 0 - 44 U/L   Alkaline Phosphatase 34 (L) 38 - 126 U/L   Total Bilirubin 0.8 0.3 - 1.2 mg/dL   GFR calc non Af Amer >60 >60 mL/min   GFR calc Af Amer >60 >60 mL/min   Anion gap 12 5 - 15    Comment: Performed at [facility], [address] [place], [place] [address]  CBC     Status: Abnormal   Collection Time: 05/02/19  4:31 PM  Result Value Ref Range   WBC 14.0 (H) 4.0 - 10.5 K/uL   RBC 5.13 (H)  3.87 - 5.11 MIL/uL   Hemoglobin 14.8 12.0 - 15.0 g/dL   HCT 60.1 09.3 - 23.5 %   MCV 86.7 80.0 - 100.0 fL   MCH 28.8 26.0 - 34.0 pg   MCHC 33.3 30.0 - 36.0 g/dL   RDW 57.3 22.0 - 25.4 %   Platelets 278 150 - 400 K/uL   nRBC 0.0 0.0 - 0.2 %    Comment: Performed at [facility], [address] [place], [place] [address]  I-Stat beta hCG blood, ED     Status: None   Collection Time: 05/02/19  4:36 PM  Result Value Ref Range   I-stat hCG, quantitative <5.0 <5 mIU/mL   Comment 3            Comment:   GEST. AGE      CONC.  (mIU/mL)   <=1 WEEK        5 - 50     2 WEEKS       50 - 500     3 WEEKS       100 - 10,000     4 WEEKS     1,000 - 30,000        FEMALE AND NON-PREGNANT FEMALE:     LESS THAN 5 mIU/mL   Urinalysis, Routine w reflex microscopic     Status: Abnormal   Collection Time: 05/02/19  4:48 PM  Result Value Ref Range   Color, Urine STRAW (A) YELLOW   APPearance CLEAR CLEAR   Specific Gravity, Urine 1.004 (L) 1.005 - 1.030   pH 6.0 5.0 -  8.0   Glucose, UA NEGATIVE NEGATIVE mg/dL   Hgb urine dipstick NEGATIVE NEGATIVE   Bilirubin Urine NEGATIVE NEGATIVE   Ketones, ur 20 (A) NEGATIVE mg/dL   Protein, ur NEGATIVE NEGATIVE mg/dL   Nitrite NEGATIVE NEGATIVE   Leukocytes,Ua NEGATIVE NEGATIVE    Comment: Performed at [facility], [address], [place], [place] [address]    Ct Abdomen Pelvis Wo Contrast  Result Date: 05/02/2019 CLINICAL DATA:  Right lower quadrant pain. EXAM: CT ABDOMEN AND PELVIS WITHOUT CONTRAST TECHNIQUE: Multidetector CT imaging of the abdomen and pelvis was performed following the standard protocol without IV contrast. COMPARISON:  None. FINDINGS: Lower chest: Normal. Hepatobiliary: No focal liver abnormality is seen. No gallstones, gallbladder wall thickening, or biliary dilatation. Pancreas: Unremarkable. No pancreatic ductal dilatation or surrounding inflammatory changes. Spleen: Normal in size without focal abnormality. Adrenals/Urinary Tract: Adrenal glands are unremarkable. Kidneys are normal, without renal calculi, focal lesion, or hydronephrosis. Bladder is unremarkable. Stomach/Bowel: The appendix is visualized and is diffusely slightly enlarged to a diameter of 8-9 mm. The appendix extends into the right side of the pelvis adjacent to the right ovary. There is no periappendiceal soft tissue stranding. The bowel otherwise appears normal. Vascular/Lymphatic: No significant vascular findings are present. No enlarged abdominal or pelvic lymph nodes. Reproductive: Uterus and ovaries appear normal. Small amount of free fluid in the pelvic cul-de-sac, normal for a premenopausal female of this age. Other: No hernia or free air. Musculoskeletal: No acute or significant osseous findings. IMPRESSION: Abnormally enlarged appendix extending deep into the right side of the pelvis consistent with early acute appendicitis. Otherwise, benign appearing abdomen and pelvis. Critical Value/emergent results were called by  telephone at the time of interpretation on 05/02/2019 at 2:06 pm to [facility] , who verbally acknowledged these results. Amanda Gilmore was advised of the report and sent to the [facility] emergency  department at the request of Dr. Renne Crigler. Electronically Signed   By: Francene Boyers M.D.   On: 05/02/2019 14:16    ROS - all of the below systems have been reviewed with the patient and positives are indicated with bold text General: chills, fever or night sweats Eyes: blurry vision or double vision ENT: epistaxis or sore throat Allergy/Immunology: itchy/watery eyes or nasal congestion Hematologic/Lymphatic: bleeding problems, blood clots or swollen lymph nodes Endocrine: temperature intolerance or unexpected weight changes Breast: new or changing breast lumps or nipple discharge Resp: cough, shortness of breath, or wheezing CV: chest pain or dyspnea on exertion GI: as per HPI GU: dysuria, trouble voiding, or hematuria MSK: joint pain or joint stiffness Neuro: TIA or stroke symptoms Derm: pruritus and skin lesion changes Psych: anxiety and depression  PE Blood pressure (!) 145/82, pulse (!) 105, temperature 97.7 F (36.5 C), temperature source Oral, resp. rate 20, SpO2 100 %. Constitutional: NAD; conversant; no deformities; wearing surgical mask Eyes: Moist conjunctiva; no lid lag; anicteric; PERRL Neck: Trachea midline; no thyromegaly Lungs: Normal respiratory effort; no tactile fremitus CV: RRR; no palpable thrills; no pitting edema GI: Abd soft, minimal right sided discomfort; not significantly distended; no palpable hepatosplenomegaly MSK: Normal gait; no clubbing/cyanosis Psychiatric: Appropriate affect; alert and oriented x3 Lymphatic: No palpable cervical or axillary lymphadenopathy  Results for orders placed or performed during the hospital encounter of 05/02/19 (from the past 48 hour(s))  Lipase, blood     Status: None   Collection Time: 05/02/19  4:31 PM  Result Value  Ref Range   Lipase 21 11 - 51 U/L    Comment: Performed at [facility], [address], [place], [place] [address]  Comprehensive metabolic panel     Status: Abnormal   Collection Time: 05/02/19  4:31 PM  Result Value Ref Range   Sodium 137 135 - 145 mmol/L   Potassium 4.6 3.5 - 5.1 mmol/L   Chloride 102 98 - 111 mmol/L   CO2 23 22 - 32 mmol/L   Glucose, Bld 97 70 - 99 mg/dL   BUN 7 6 - 20 mg/dL   Creatinine, Ser 7.82 0.44 - 1.00 mg/dL   Calcium 9.3 8.9 - 95.6 mg/dL   Total Protein 7.5 6.5 - 8.1 g/dL   Albumin 4.4 3.5 - 5.0 g/dL   AST 21 15 - 41 U/L   ALT 26 0 - 44 U/L   Alkaline Phosphatase 34 (L) 38 - 126 U/L   Total Bilirubin 0.8 0.3 - 1.2 mg/dL   GFR calc non Af Amer >60 >60 mL/min   GFR calc Af Amer >60 >60 mL/min   Anion gap 12 5 - 15    Comment: Performed at [facility], [address], [place], [place] [address]  CBC     Status: Abnormal   Collection Time: 05/02/19  4:31 PM  Result Value Ref Range   WBC 14.0 (H) 4.0 - 10.5 K/uL   RBC 5.13 (H) 3.87 - 5.11 MIL/uL   Hemoglobin 14.8 12.0 - 15.0 g/dL   HCT 65.7 84.6 - 96.2 %   MCV 86.7 80.0 - 100.0 fL   MCH 28.8 26.0 - 34.0 pg   MCHC 33.3 30.0 - 36.0 g/dL   RDW 95.2 84.1 - 32.4 %   Platelets 278 150 - 400 K/uL   nRBC 0.0 0.0 - 0.2 %    Comment: Performed at [facility], [address] [place], [place] [address]  I-Stat beta hCG blood, ED     Status: None   Collection Time: 05/02/19  4:36 PM  Result Value Ref Range   I-stat hCG, quantitative <5.0 <5 mIU/mL   Comment 3            Comment:   GEST. AGE      CONC.  (mIU/mL)   <=1 WEEK        5 - 50     2 WEEKS       50 - 500     3 WEEKS       100 - 10,000     4 WEEKS     1,000 - 30,000        FEMALE AND NON-PREGNANT FEMALE:     LESS THAN 5 mIU/mL   Urinalysis, Routine w reflex microscopic     Status: Abnormal   Collection Time: 05/02/19  4:48 PM  Result Value Ref Range   Color, Urine STRAW (A) YELLOW   APPearance CLEAR CLEAR   Specific  Gravity, Urine 1.004 (L) 1.005 - 1.030   pH 6.0 5.0 - 8.0   Glucose, UA NEGATIVE NEGATIVE mg/dL   Hgb urine dipstick NEGATIVE NEGATIVE   Bilirubin Urine NEGATIVE NEGATIVE   Ketones, ur 20 (A) NEGATIVE mg/dL   Protein, ur NEGATIVE NEGATIVE mg/dL   Nitrite NEGATIVE NEGATIVE   Leukocytes,Ua NEGATIVE NEGATIVE    Comment: Performed at [facility], [address] [place], [place] [address]    Ct Abdomen Pelvis Wo Contrast  Result Date: 05/02/2019 CLINICAL DATA:  Right lower quadrant pain. EXAM: CT ABDOMEN AND PELVIS WITHOUT CONTRAST TECHNIQUE: Multidetector CT imaging of the abdomen and pelvis was performed following the standard protocol without IV contrast. COMPARISON:  None. FINDINGS: Lower chest: Normal. Hepatobiliary: No focal liver abnormality is seen. No gallstones, gallbladder wall thickening, or biliary dilatation. Pancreas: Unremarkable. No pancreatic ductal dilatation or surrounding inflammatory changes. Spleen: Normal in size without focal abnormality. Adrenals/Urinary Tract: Adrenal glands are unremarkable. Kidneys are normal, without renal calculi, focal lesion, or hydronephrosis. Bladder is unremarkable. Stomach/Bowel: The appendix is visualized and is diffusely slightly enlarged to a diameter of 8-9 mm. The appendix extends into the right side of the pelvis adjacent to the right ovary. There is no periappendiceal soft tissue stranding. The bowel otherwise appears normal. Vascular/Lymphatic: No significant vascular findings are present. No enlarged abdominal or pelvic lymph nodes. Reproductive: Uterus and ovaries appear normal. Small amount of free fluid in the pelvic cul-de-sac, normal for a premenopausal female of this age. Other: No hernia or free air. Musculoskeletal: No acute or significant osseous findings. IMPRESSION: Abnormally enlarged appendix extending deep into the right side of the pelvis consistent with early acute appendicitis. Otherwise, benign appearing abdomen and  pelvis. Critical Value/emergent results were called by telephone at the time of interpretation on 05/02/2019 at 2:06 pm to [facility] , who verbally acknowledged these results. Amanda Gilmore was advised of the report and sent to the [facility] emergency department at the request of Dr. Renne Crigler. Electronically Signed   By: Francene Boyers M.D.   On: 05/02/2019 14:16   A/P: Amanda Gilmore is an 43 y.o. female with leukocytosis right sided mild discomfort but CT showing an enlarged appendix - constellation of findings consistent with acute appendicitis  -The anatomy and physiology of the GI tract was discussed at length with the patient and her husband at bedside. The pathophysiology of appendicitis was discussed at length as well. -I discussed options  moving forward for treatment including observation with IV abx vs surgery. We discussed that with antibiotics alone, there is reasonable success in managing appendicitis without needing an operation. We discussed risks of recurrence at 75yrs being as high as 30-40% in some studies (APPAC trial in cases of acute uncomplicated appendicitis - exclusions being perforation, abscess, suspected malignancy, appenddicolith, age <18 or >60, peritonitis, serious comorbidities). We discussed surgical procedure including laparoscopic and potential open techniques. We discussed the material risks (including, but not limited to, pain, bleeding, infection, scarring, need for blood transfusion, damage to surrounding structures- blood vessels/nerves/viscus/organs, need for additional procedures,  hernia, failure to resolve all symptoms, recurrence although quite low, pneumonia, heart attack, stroke, death) benefits and alternatives to surgery were discussed at length. The patient's questions were answered to her satisfaction, she voiced understanding and has elected to proceed with surgery. Additionally, we discussed typical postoperative expectations and the recovery  process.  Sharon Mt. Dema Severin, M.D. [facility]

## 2019-05-02 NOTE — ED Notes (Signed)
Husband updated about the process, husband complaining about no knowing if the surgery will be done today or tomorrow, morning. Husband a pt oriented that this RN will only know this one's the call from the OR and I didn't get that call yet, I will try to follow up with OR to see when the surgery will be done, for now I see a bed request and I don't have control of when this will happen for sure. This RN called OR and find out that surgery will be done tonight, pt to be move to short stay 36.

## 2019-05-02 NOTE — ED Notes (Signed)
Husband asking to speak with a doctor; Dr.Lawsing at bedside

## 2019-05-02 NOTE — Op Note (Signed)
Amanda Gilmore [id number]   PRE-OPERATIVE DIAGNOSIS:  Acute appendicits  POST-OPERATIVE DIAGNOSIS:  Same  Procedure(s): APPENDECTOMY LAPAROSCOPIC  PROCEDURE:  1. Laparoscopic appendectomy 2. Excisional biopsy of peritoneal nodule  SURGEON:  Stephanie Coup. Cliffton Asters, M.D.  ANESTHESIA: General endotracheal  EBL:   5 mL  DRAINS: None  SPECIMEN:   1. Appendix 2. Peritoneal nodule - evaluate for possible endometriosis  COUNTS:  Sponge, needle and instrument counts were reported correct x2 at conclusion of the operation  DISPOSITION:  PACU in satisfactory condition  COMPLICATIONS: None  FINDINGS: Mildly inflamed appendix without evidence of perforation or abscess. Standard laparoscopic appendectomy. On peritoneum just anterior to uterus, a ~2 mm nodule was noted on the peritoneum with powder-burn appearance, suspicious for a focus of endometriosis. This was carefully excised with endoshears and passed off as specimen. No other pathology noted or other areas which may represent endometriosis.  DESCRIPTION:   The patient was identified & brought into the operating room. SCDs were in place and functioning. General endotracheal anesthesia was administered. Preoperative antibiotics were administered. The patient was positioned supine with left arm tucked. Hair on the abdomen was then clipped by the OR team. A foley catheter was inserted under sterile conditions. The abdomen was prepped and draped in the standard sterile fashion. A surgical timeout confirmed our plan.  A small incision was made in the infraumbilical skin. The subcutaneous tissue was dissected and the umbilical stalk identified. The stalk was grasped with a Kocher and retracted outwardly. The infraumbilical fascia was exposed and incised. Peritoneal entry was carefully made bluntly. A 0 Vicryl purse-string suture was placed and then the [facility] port was introduced into the abdomen.  CO2 insufflation commenced to . The  laparoscope was inserted and confirmed no evidence of trocar site complications. She was then positioned in Trendelenburg. Two additional ports were placed - one in left lower quadrant and another in the suprapubic midline taking care to stay well above the bladder - 3 fingerbreadths above the pubic symphysis. The bed was then slightly tilted to place the left side down.  The appendix was identified lying in the right hemipelvis and appeared mildly inflamed.  The terminal ileum was partially mobilized sharply to facilitate exposure of the appendiceal base.  Using [employer], the base the appendix was dissected out from the appendiceal mesentery.  The appendix was then taken flush with the cecum including a small cuff of cecum with the appendix using a laparoscopic GIA stapler with a blue load.  The staple line was noted to be intact and hemostatic.  The base was viable and healthy in appearance.  The terminal ileum, cecum and ascending colon also appeared normal. The mesoappendix was then ligated using the harmonic scalpel. The mesoappendix was inspected and noted to have one small focus of bleeding which was controlled with 5 mm clips.  The appendix was placed in an EndoBag and removed from the abdomen, passed off as specimen.  The right lower quadrant was conservatively irrigated. Hemostasis was noted to be achieved - taking time to inspect the ligated mesoappendix, colon mesentery, and retroperitoneum. Staple line was noted to be intact on the cecum with no bleeding.   The pelvis was surveyed and both tubes and ovaries were normal in appearance.  The uterus was normal in appearance.  There was a small nodule on the peritoneum just anterior to the uterus that was approximately 2 mm in size with a powder burn appearance.  This was grasped and the nodule with  the peritoneum was excised using EndoShears.  This was excised in its entirety and passed off as a separate specimen.  The site of excision was  hemostatic.  The left lower quadrant and suprapubic ports were removed under direct visualization. The EndoBag was then removed through the umbilical port site and passed off as specimen. The CO2 was exhausted from the abdomen. The umbilical fascia was then closed by closing the 0 Vicryl suture. The fascia was palpated and noted to be completely closed. The skin of all port sites was then approximated using 4-0 Monocryl suture. The incisions were covered with Dermabond.  She was then awakened from general anesthesia, extubated, and transferred to a stretcher for transport to recover in satisfactory condition.

## 2019-05-02 NOTE — Transfer of Care (Signed)
Immediate Anesthesia Transfer of Care Note  Patient: Amanda Gilmore  Procedure(s) Performed: APPENDECTOMY LAPAROSCOPIC (N/A Abdomen)  Patient Location: PACU  Anesthesia Type:General  Level of Consciousness: awake, alert  and oriented  Airway & Oxygen Therapy: Patient Spontanous Breathing and Patient connected to nasal cannula oxygen  Post-op Assessment: Report given to RN and Post -op Vital signs reviewed and stable  Post vital signs: Reviewed and stable  Last Vitals:  Vitals Value Taken Time  BP 131/69 05/02/19 2323  Temp    Pulse 103 05/02/19 2325  Resp 19 05/02/19 2325  SpO2 96 % 05/02/19 2325  Vitals shown include unvalidated device data.  Last Pain:  Vitals:   05/02/19 1814  TempSrc:   PainSc: 4          Complications: No apparent anesthesia complications

## 2019-05-02 NOTE — Anesthesia Preprocedure Evaluation (Signed)
Anesthesia Evaluation  Patient identified by MRN, date of birth, ID band Patient awake    Reviewed: Allergy & Precautions, NPO status , Patient's Chart, lab work & pertinent test results  Airway Mallampati: II  TM Distance: >3 FB     Dental   Pulmonary    breath sounds clear to auscultation       Cardiovascular negative cardio ROS   Rhythm:Regular Rate:Normal     Neuro/Psych    GI/Hepatic Neg liver ROS, History noted CG   Endo/Other  Hypothyroidism   Renal/GU negative Renal ROS     Musculoskeletal   Abdominal   Peds  Hematology   Anesthesia Other Findings   Reproductive/Obstetrics                             Anesthesia Physical Anesthesia Plan  ASA: I and emergent  Anesthesia Plan: General   Post-op Pain Management:    Induction: Intravenous  PONV Risk Score and Plan: 3 and Ondansetron, Dexamethasone and Midazolam  Airway Management Planned: Oral ETT  Additional Equipment:   Intra-op Plan:   Post-operative Plan: Extubation in OR  Informed Consent: I have reviewed the patients History and Physical, chart, labs and discussed the procedure including the risks, benefits and alternatives for the proposed anesthesia with the patient or authorized representative who has indicated his/her understanding and acceptance.     Dental advisory given  Plan Discussed with: CRNA and Anesthesiologist  Anesthesia Plan Comments:         Anesthesia Quick Evaluation

## 2019-05-02 NOTE — ED Triage Notes (Addendum)
Pt was at PCP today to get Right sided pain checked. PCP did CT at [facility] and said it was likely appendicitis. Told pt to come here to have a surgeon look at CT and decide if surgery needed to be done or can be treated with medication.

## 2019-05-02 NOTE — ED Notes (Signed)
In room to start IV.  Patient requesting that I hold off until we know if she'll be admitted or not.

## 2019-05-02 NOTE — ED Notes (Signed)
Pt refusing to have IV access completed until she talks to her husband and find out if she will have or not surgery. Pt oriented that in order for her to be admitted to the hospital she will need IV access, pt still asking for more time to think about it, states she never had one before and she is really scared of needles.

## 2019-05-02 NOTE — ED Provider Notes (Signed)
[facility]ENC[facility] Provider Note   CSN: [id number] Arrival date & time: 05/02/19  1528     History   Chief Complaint Chief Complaint  Patient presents with   Appendicitis    HPI Amanda Gilmore is a 43 y.o. female.      Abdominal Pain Pain location:  RLQ Pain quality: aching and gnawing   Duration:  7 days Timing:  Intermittent Context: not diet changes, not recent illness, not sick contacts and not suspicious food intake   Associated symptoms: no anorexia, no chest pain, no chills, no cough, no dysuria, no fever, no hematuria, no nausea, no shortness of breath and no vomiting   Risk factors: not obese and not pregnant    The patient is a 43 year old female with a hx of hypothyroidism and anxiety who presents to the ED with CT proven appendicitis. The patient describes one week of mild symptoms, described as occasional twinges in her RLQ and a generalized feeling of malaise. She initially thought her pain was ovulatory. She has never had this pain before. She denies fevers, chills, periumbilical pain, migratory abdominal pain, nausea, vomiting, diarrhea, anorexia.   She presented to her PCP who ordered a CT of the Abdomen and pelvis which was significant for an abnormally enlarged appendix extending deep into the right side of the pelvis consistent with early acute appendicitis. [facility].  Past Medical History:  Diagnosis Date   Anxiety    Hypothyroid     Patient Active Problem List   Diagnosis Date Noted   OCD (obsessive compulsive disorder) 05/02/2018   Palpitations 05/26/2014    History reviewed. No pertinent surgical history.   OB History   No obstetric history on file.      Home Medications    Prior to Admission medications   Medication Sig Start Date End Date Taking? Authorizing Provider  acetaminophen (TYLENOL) 500 MG tablet Take 1,000 mg by mouth every 6 (six) hours as needed  for moderate pain or headache.   Yes [provider]  busPIRone (BUSPAR) 15 MG tablet TAKE 1/3 TAB TWICE A DAY FOR 1 WEEK, THEN 2/3 TAB BY MOUTH TWICE DAILY FOR 1 WK, TAKE 1 TABLET BY MOUTH TWICE DAILY Patient taking differently: Take 5-15 mg by mouth See admin instructions. TAKE 1/3 TAB TWICE A DAY FOR 1 WEEK, THEN 2/3 TAB BY MOUTH TWICE DAILY FOR 1 WK, TAKE 1 TABLET BY MOUTH TWICE DAILY 04/01/19  Yes Corie Chiquitoarter, Jessica, PMHNP  DULoxetine (CYMBALTA) 20 MG capsule TAKE 40 MG BY MOUTH EVERY MORNING. Patient taking differently: Take 40 mg by mouth daily.  03/17/19  Yes Corie Chiquitoarter, Jessica, PMHNP  levothyroxine (SYNTHROID) 88 MCG tablet Take 88 mg by mouth daily.  04/08/14  Yes [provider]  Multiple Vitamin (MULTIVITAMIN) tablet Take 1 tablet by mouth daily.   Yes [provider]  Probiotic Product (PROBIOTIC PO) Take 1 tablet by mouth daily.    Yes [provider]    Family History Family History  Problem Relation Age of Onset   CAD Father 3270       7283   Hypertension Mother     Social History Social History   Tobacco Use   Smoking status: Never Smoker   Smokeless tobacco: Never Used  Substance Use Topics   Alcohol use: Not on file   Drug use: Not on file     Allergies   Latuda [lurasidone]   Review  of Systems Review of Systems  Constitutional: Negative for chills and fever.  Respiratory: Negative for cough and shortness of breath.   Cardiovascular: Negative for chest pain and palpitations.  Gastrointestinal: Positive for abdominal pain. Negative for anorexia, nausea and vomiting.  Genitourinary: Negative for dysuria and hematuria.  Skin: Negative for color change and rash.  Neurological: Negative for syncope.  All other systems reviewed and are negative.    Physical Exam Updated Vital Signs BP 109/77    Pulse 99    Temp 97.7 F (36.5 C) (Oral)    Resp 15    SpO2 99%   Physical Exam Vitals signs and nursing note reviewed.    Constitutional:      General: She is not in acute distress.    Appearance: She is well-developed.  HENT:     Head: Normocephalic and atraumatic.  Eyes:     Conjunctiva/sclera: Conjunctivae normal.  Neck:     Musculoskeletal: Neck supple.  Cardiovascular:     Rate and Rhythm: Normal rate and regular rhythm.     Heart sounds: No murmur.  Pulmonary:     Effort: Pulmonary effort is normal. No respiratory distress.     Breath sounds: Normal breath sounds.  Abdominal:     Palpations: Abdomen is soft.     Tenderness: There is no abdominal tenderness. There is no right CVA tenderness or left CVA tenderness.     Comments: Negative Mcburney's point tenderness, negative rosvings sign, negative psoas and obturator signs  Skin:    General: Skin is warm and dry.  Neurological:     Mental Status: She is alert.      ED Treatments / Results  Labs (all labs ordered are listed, but only abnormal results are displayed) Labs Reviewed  COMPREHENSIVE METABOLIC PANEL - Abnormal; Notable for the following components:      Result Value   Alkaline Phosphatase 34 (*)    All other components within normal limits  CBC - Abnormal; Notable for the following components:   WBC 14.0 (*)    RBC 5.13 (*)    All other components within normal limits  URINALYSIS, ROUTINE W REFLEX MICROSCOPIC - Abnormal; Notable for the following components:   Color, Urine STRAW (*)    Specific Gravity, Urine 1.004 (*)    Ketones, ur 20 (*)    All other components within normal limits  SARS CORONAVIRUS 2 BY RT PCR (HOSPITAL ORDER, PERFORMED IN [facility])  LIPASE, BLOOD  I-STAT BETA HCG BLOOD, ED ([facility], [facility], [facility] ONLY)    EKG None  Radiology Ct Abdomen Pelvis Wo Contrast  Result Date: 05/02/2019 CLINICAL DATA:  Right lower quadrant pain. EXAM: CT ABDOMEN AND PELVIS WITHOUT CONTRAST TECHNIQUE: Multidetector CT imaging of the abdomen and pelvis was performed following the standard protocol without IV  contrast. COMPARISON:  None. FINDINGS: Lower chest: Normal. Hepatobiliary: No focal liver abnormality is seen. No gallstones, gallbladder wall thickening, or biliary dilatation. Pancreas: Unremarkable. No pancreatic ductal dilatation or surrounding inflammatory changes. Spleen: Normal in size without focal abnormality. Adrenals/Urinary Tract: Adrenal glands are unremarkable. Kidneys are normal, without renal calculi, focal lesion, or hydronephrosis. Bladder is unremarkable. Stomach/Bowel: The appendix is visualized and is diffusely slightly enlarged to a diameter of 8-9 mm. The appendix extends into the right side of the pelvis adjacent to the right ovary. There is no periappendiceal soft tissue stranding. The bowel otherwise appears normal. Vascular/Lymphatic: No significant vascular findings are present. No enlarged abdominal or pelvic lymph nodes.  Reproductive: Uterus and ovaries appear normal. Small amount of free fluid in the pelvic cul-de-sac, normal for a premenopausal female of this age. Other: No hernia or free air. Musculoskeletal: No acute or significant osseous findings. IMPRESSION: Abnormally enlarged appendix extending deep into the right side of the pelvis consistent with early acute appendicitis. Otherwise, benign appearing abdomen and pelvis. Critical Value/emergent results were called by telephone at the time of interpretation on 05/02/2019 at 2:06 pm to [facility] , who verbally acknowledged these results. Vallery Mcdade was advised of the report and sent to the [facility] emergency department at the request of Dr. Shelia Media. Electronically Signed   By: Lorriane Shire M.D.   On: 05/02/2019 14:16    Procedures Procedures (including critical care time)  Medications Ordered in ED Medications  lactated ringers bolus 500 mL (500 mLs Intravenous New Bag/Given 05/02/19 2029)  succinylcholine (ANECTINE) 200 MG/10ML syringe (has no administration in time range)  lidocaine 20 MG/ML injection  (has no administration in time range)  rocuronium bromide 100 MG/10ML SOSY (has no administration in time range)  ePHEDrine 5 MG/ML injection (has no administration in time range)  phenylephrine 0.4-0.9 MG/10ML-% injection (has no administration in time range)  dexamethasone (DECADRON) 10 MG/ML injection (has no administration in time range)  ondansetron (ZOFRAN) 4 MG/2ML injection (has no administration in time range)  propofol (DIPRIVAN) 10 mg/mL bolus/IV push (has no administration in time range)  midazolam (VERSED) 2 MG/2ML injection (has no administration in time range)  fentaNYL (SUBLIMAZE) 250 MCG/5ML injection (has no administration in time range)     Initial Impression / Assessment and Plan / ED Course  I have reviewed the triage vital signs and the nursing notes.  Pertinent labs & imaging results that were available during my care of the patient were reviewed by me and considered in my medical decision making (see chart for details).  Clinical Course as of May 02 2039  Fri May 02, 2019  1918 WBC(!): 14.0 [JL]    Clinical Course User Index [JL] Regan Lemming, MD       The patient is a 43 year old female who presents to the [facility] ED with CT findings concerning for early appendicitis. She has had mild symptoms for the past week. She is afebrile and hemodynamically stable. Labs significant for a mild leukocytosis to 14. Physical exam generally unremarkable. The patient was afebrile, HDS, in NAD on arrival. Surgery consulted on arrival.   Surgery evaluated the patient and will plan to proceed with surgical management. The patient was made NPO and provided an IVF bolus. She was subsequently admitted.  Final Clinical Impressions(s) / ED Diagnoses   Final diagnoses:  Acute appendicitis, unspecified acute appendicitis type    ED Discharge Orders    None       Regan Lemming, MD 05/02/19 2040    Tegeler, Gwenyth Allegra, MD 05/03/19 1029

## 2019-05-02 NOTE — Anesthesia Procedure Notes (Signed)
Procedure Name: Intubation Date/Time: 05/02/2019 10:02 PM Performed by: Jearld Pies, CRNA Pre-anesthesia Checklist: Patient identified, Emergency Drugs available, Suction available and Patient being monitored Patient Re-evaluated:Patient Re-evaluated prior to induction Oxygen Delivery Method: Circle System Utilized Preoxygenation: Pre-oxygenation with 100% oxygen Induction Type: IV induction, Rapid sequence and Cricoid Pressure applied Laryngoscope Size: Mac and 3 Grade View: Grade II Tube type: Oral Tube size: 7.0 mm Number of attempts: 1 Airway Equipment and Method: Stylet and Oral airway Placement Confirmation: ETT inserted through vocal cords under direct vision,  positive ETCO2 and breath sounds checked- equal and bilateral Secured at: 22 cm Tube secured with: Tape Dental Injury: Teeth and Oropharynx as per pre-operative assessment

## 2019-05-03 LAB — CBC WITH DIFFERENTIAL/PLATELET
Abs Immature Granulocytes: 0.1 10*3/uL — ABNORMAL HIGH (ref 0.00–0.07)
Basophils Absolute: 0 10*3/uL (ref 0.0–0.1)
Basophils Relative: 0 %
Eosinophils Absolute: 0 10*3/uL (ref 0.0–0.5)
Eosinophils Relative: 0 %
HCT: 42.7 % (ref 36.0–46.0)
Hemoglobin: 13.9 g/dL (ref 12.0–15.0)
Immature Granulocytes: 1 %
Lymphocytes Relative: 4 %
Lymphs Abs: 0.8 10*3/uL (ref 0.7–4.0)
MCH: 28.2 pg (ref 26.0–34.0)
MCHC: 32.6 g/dL (ref 30.0–36.0)
MCV: 86.6 fL (ref 80.0–100.0)
Monocytes Absolute: 0.1 10*3/uL (ref 0.1–1.0)
Monocytes Relative: 1 %
Neutro Abs: 18.6 10*3/uL — ABNORMAL HIGH (ref 1.7–7.7)
Neutrophils Relative %: 94 %
Platelets: 285 10*3/uL (ref 150–400)
RBC: 4.93 MIL/uL (ref 3.87–5.11)
RDW: 12.8 % (ref 11.5–15.5)
WBC: 19.7 10*3/uL — ABNORMAL HIGH (ref 4.0–10.5)
nRBC: 0 % (ref 0.0–0.2)

## 2019-05-03 LAB — HIV ANTIBODY (ROUTINE TESTING W REFLEX): HIV Screen 4th Generation wRfx: NONREACTIVE

## 2019-05-03 MED ORDER — HYDROMORPHONE HCL 1 MG/ML IJ SOLN: 0.5000 mg | INTRAMUSCULAR | Status: DC | PRN

## 2019-05-03 MED ORDER — IBUPROFEN 600 MG PO TABS: 600.0000 mg | ORAL_TABLET | Freq: Four times a day (QID) | ORAL | Status: DC | PRN

## 2019-05-03 MED ORDER — ONDANSETRON 4 MG PO TBDP: 4.0000 mg | ORAL_TABLET | Freq: Four times a day (QID) | ORAL | Status: DC | PRN

## 2019-05-03 MED ORDER — SIMETHICONE 80 MG PO CHEW: 40.0000 mg | CHEWABLE_TABLET | Freq: Four times a day (QID) | ORAL | Status: DC | PRN

## 2019-05-03 MED ORDER — DIPHENHYDRAMINE HCL 50 MG/ML IJ SOLN: 12.5000 mg | Freq: Four times a day (QID) | INTRAMUSCULAR | Status: DC | PRN

## 2019-05-03 MED ORDER — ONDANSETRON HCL 4 MG/2ML IJ SOLN: 4.0000 mg | Freq: Four times a day (QID) | INTRAMUSCULAR | Status: DC | PRN

## 2019-05-03 MED ORDER — HEPARIN SODIUM (PORCINE) 5000 UNIT/ML IJ SOLN
5000.0000 [IU] | Freq: Three times a day (TID) | INTRAMUSCULAR | Status: DC
  Filled 2019-05-03: qty 1

## 2019-05-03 MED ORDER — DULOXETINE HCL 20 MG PO CPEP
40.0000 mg | ORAL_CAPSULE | Freq: Every day | ORAL | Status: DC
  Administered 2019-05-03: 40 mg via ORAL
  Filled 2019-05-03: qty 2

## 2019-05-03 MED ORDER — DIPHENHYDRAMINE HCL 12.5 MG/5ML PO ELIX: 12.5000 mg | ORAL_SOLUTION | Freq: Four times a day (QID) | ORAL | Status: DC | PRN

## 2019-05-03 MED ORDER — TRAMADOL HCL 50 MG PO TABS: 50.0000 mg | ORAL_TABLET | Freq: Four times a day (QID) | ORAL | 0 refills | Status: DC | PRN

## 2019-05-03 MED ORDER — LEVOTHYROXINE SODIUM 88 MCG PO TABS
88.0000 ug | ORAL_TABLET | Freq: Every day | ORAL | Status: DC
  Administered 2019-05-03: 88 ug via ORAL
  Filled 2019-05-03: qty 1

## 2019-05-03 MED ORDER — HEPARIN SODIUM (PORCINE) 5000 UNIT/ML IJ SOLN: 5000.0000 [IU] | Freq: Three times a day (TID) | INTRAMUSCULAR | Status: DC

## 2019-05-03 MED ORDER — TRAMADOL HCL 50 MG PO TABS
50.0000 mg | ORAL_TABLET | Freq: Four times a day (QID) | ORAL | Status: DC | PRN
  Administered 2019-05-03: 11:00:00 50 mg via ORAL
  Filled 2019-05-03: qty 1

## 2019-05-03 MED ORDER — ACETAMINOPHEN 500 MG PO TABS: 1000.0000 mg | ORAL_TABLET | Freq: Four times a day (QID) | ORAL | Status: DC

## 2019-05-03 MED ORDER — ACETAMINOPHEN 500 MG PO TABS
1000.0000 mg | ORAL_TABLET | Freq: Four times a day (QID) | ORAL | Status: DC
  Administered 2019-05-03 (×2): 1000 mg via ORAL
  Filled 2019-05-03 (×2): qty 2

## 2019-05-03 MED ORDER — TRAMADOL HCL 50 MG PO TABS: 50.0000 mg | ORAL_TABLET | Freq: Four times a day (QID) | ORAL | Status: DC | PRN

## 2019-05-03 MED ORDER — BUSPIRONE HCL 5 MG PO TABS: 5.0000 mg | ORAL_TABLET | ORAL | Status: DC

## 2019-05-03 MED ORDER — IBUPROFEN 600 MG PO TABS
600.0000 mg | ORAL_TABLET | Freq: Four times a day (QID) | ORAL | Status: DC | PRN
  Administered 2019-05-03: 600 mg via ORAL
  Filled 2019-05-03: qty 1

## 2019-05-03 MED ORDER — LACTATED RINGERS IV SOLN: INTRAVENOUS | Status: DC

## 2019-05-03 NOTE — Plan of Care (Signed)
  Problem: Education: Goal: Knowledge of General Education information will improve Description Including pain rating scale, medication(s)/side effects and non-pharmacologic comfort measures Outcome: Progressing   

## 2019-05-03 NOTE — Discharge Summary (Signed)
[facility]/Trauma Discharge Summary   Patient ID: Amanda Gilmore MRN: [id number] DOB/AGE: 1976-07-17 43 y.o.  Admit date: 05/02/2019 Discharge date: 05/03/2019  Admitting Diagnosis: Appendicitis  Discharge Diagnosis Patient Active Problem List   Diagnosis Date Noted  . Acute appendicitis 05/02/2019  . S/P laparoscopic appendectomy 05/02/2019  . OCD (obsessive compulsive disorder) 05/02/2018  . Palpitations 05/26/2014    Consultants None  Imaging: Ct Abdomen Pelvis Wo Contrast  Result Date: 05/02/2019 CLINICAL DATA:  Right lower quadrant pain. EXAM: CT ABDOMEN AND PELVIS WITHOUT CONTRAST TECHNIQUE: Multidetector CT imaging of the abdomen and pelvis was performed following the standard protocol without IV contrast. COMPARISON:  None. FINDINGS: Lower chest: Normal. Hepatobiliary: No focal liver abnormality is seen. No gallstones, gallbladder wall thickening, or biliary dilatation. Pancreas: Unremarkable. No pancreatic ductal dilatation or surrounding inflammatory changes. Spleen: Normal in size without focal abnormality. Adrenals/Urinary Tract: Adrenal glands are unremarkable. Kidneys are normal, without renal calculi, focal lesion, or hydronephrosis. Bladder is unremarkable. Stomach/Bowel: The appendix is visualized and is diffusely slightly enlarged to a diameter of 8-9 mm. The appendix extends into the right side of the pelvis adjacent to the right ovary. There is no periappendiceal soft tissue stranding. The bowel otherwise appears normal. Vascular/Lymphatic: No significant vascular findings are present. No enlarged abdominal or pelvic lymph nodes. Reproductive: Uterus and ovaries appear normal. Small amount of free fluid in the pelvic cul-de-sac, normal for a premenopausal female of this age. Other: No hernia or free air. Musculoskeletal: No acute or significant osseous findings. IMPRESSION: Abnormally enlarged appendix extending deep into the right side of the pelvis  consistent with early acute appendicitis. Otherwise, benign appearing abdomen and pelvis. Critical Value/emergent results were called by telephone at the time of interpretation on 05/02/2019 at 2:06 pm to [facility] , who verbally acknowledged these results. Amanda Gilmore was advised of the report and sent to the [facility] emergency department at the request of Dr. Shelia Media. Electronically Signed   By: Lorriane Shire M.D.   On: 05/02/2019 14:16    Procedures Dr. Dema Severin (05/02/19) - Laparoscopic Appendectomy  HPI: Amanda Gilmore is an 43 y.o. female with no known medical hx who reports an approximate 6 to 7-day history of mild somewhat vague right lower abdominal discomfort that she thought was ovulatory pain.  She denies any fever/chills or nausea/vomiting.  Her only symptom has been this vague achy discomfort.  She has never had this before.  She reports this does not appear to radiate.  Nothing seems to make it better or worse.  She spoke with her primary care and was sent for a CT abdomen pelvis.  Outpatient CT was completed today and demonstrated an abnormally enlarged appendix extending into the deep right side of the pelvis consistent with early acute appendicitis.  It was measured at 8 to 9 mm in size.  There is no significant periappendiceal soft tissue stranding per se.  She was advised emergency department underwent work-up.  She has been afebrile.  She had a white blood cell count that returned 14.  We were asked to see  She denies any prior surgical history including even wisdom teeth.  She works as Engineer, water. Her husband is a Pharmacist, hospital here in town.  Hospital Course:  Workup showed appendicitis.  Patient was admitted and underwent procedure listed above.  Tolerated procedure well and was transferred to the floor.  Diet was advanced as tolerated.  On POD #1, the patient was voiding well, tolerating  diet, ambulating well, pain well controlled, vital  signs stable, incisions c/d/i and felt stable for discharge home.  Patient will follow up as outlined below and knows to call with questions or concerns.     Patient was discharged in good condition.  The [place] Substance controlled database was reviewed prior to prescribing narcotic pain medication to this patient.  Physical Exam: General:  Alert, NAD, pleasant, cooperative Cardio: RRR, S1 & S2 normal, no murmur, rubs, gallops Resp: Effort normal, lungs CTA bilaterally, no wheezes, rales, rhonchi Abd:  Soft, ND, normal bowel sounds, no tenderness, incisions with glue intact are well-appearing and are without drainage or bleeding.  No peritonitis Skin: warm and dry, warm and dry  Allergies as of 05/03/2019      Reactions   Latuda [lurasidone] Shortness Of Breath   Tingling in hands      Medication List    TAKE these medications   acetaminophen 500 MG tablet Commonly known as: TYLENOL Take 1,000 mg by mouth every 6 (six) hours as needed for moderate pain or headache.   busPIRone 15 MG tablet Commonly known as: BUSPAR TAKE 1/3 TAB TWICE A DAY FOR 1 WEEK, THEN 2/3 TAB BY MOUTH TWICE DAILY FOR 1 WK, TAKE 1 TABLET BY MOUTH TWICE DAILY What changed:   how much to take  how to take this  when to take this   DULoxetine 20 MG capsule Commonly known as: CYMBALTA TAKE 40 MG BY MOUTH EVERY MORNING. What changed: See the new instructions.   multivitamin tablet Take 1 tablet by mouth daily.   PROBIOTIC PO Take 1 tablet by mouth daily.   Synthroid 88 MCG tablet Generic drug: levothyroxine Take 88 mcg by mouth daily.   traMADol 50 MG tablet Commonly known as: ULTRAM Take 1 tablet (50 mg total) by mouth every 6 (six) hours as needed (pain not controlled with tylenol and ibuprofen).        Follow-up Information    [facility], [place]. Schedule an appointment as soon as possible for a visit in 2 week(s).   Specialty: General Surgery Why: for follow up.  Call our office Monday morning to schedule an appointment Contact information: [address] [place] [address] [phone]          Signed: Joyce Copa [facility] Surgery 05/03/2019, 8:46 AM Pager: [phone] Consults: [phone] Mon-Fri 7:00 am-4:30 pm Sat-Sun 7:00 am-11:30 am

## 2019-05-03 NOTE — Plan of Care (Signed)
Pt for discharge going home, alert and oriented , ambulatory, independent, wound site with skin glue dry and intact, discontinued peripheral IV line, able to eat her breakfast tolerates well, given health teachings, next appointment, due med explained and understood, given all her personal belongings, waiting for her husband to pick her up, no complain of pain at this time.

## 2019-05-03 NOTE — Discharge Instructions (Signed)

## 2019-05-03 NOTE — Progress Notes (Signed)
Pt discharged home with husband via wheelchair assisted by Nurse tech.

## 2019-05-03 NOTE — Anesthesia Postprocedure Evaluation (Signed)
Anesthesia Post Note  Patient: Amanda Gilmore  Procedure(s) Performed: APPENDECTOMY LAPAROSCOPIC (N/A Abdomen)     Patient location during evaluation: PACU Anesthesia Type: General Level of consciousness: awake Pain management: pain level controlled Vital Signs Assessment: post-procedure vital signs reviewed and stable Respiratory status: spontaneous breathing Cardiovascular status: stable Postop Assessment: no apparent nausea or vomiting Anesthetic complications: no    Last Vitals:  Vitals:   05/03/19 0007 05/03/19 0037  BP: 110/67 104/67  Pulse: 100 93  Resp: 17 20  Temp: 37.1 C 36.9 C  SpO2: 97% 96%    Last Pain:  Vitals:   05/03/19 0037  TempSrc: Oral  PainSc:                  [name]

## 2019-05-05 ENCOUNTER — Encounter (HOSPITAL_COMMUNITY): Payer: Self-pay | Admitting: Surgery

## 2019-05-05 LAB — SURGICAL PATHOLOGY

## 2019-05-07 ENCOUNTER — Other Ambulatory Visit: Payer: Self-pay | Admitting: Internal Medicine

## 2019-05-26 ENCOUNTER — Other Ambulatory Visit: Payer: Self-pay | Admitting: Internal Medicine

## 2019-05-26 DIAGNOSIS — Z1231 Encounter for screening mammogram for malignant neoplasm of breast: Secondary | ICD-10-CM

## 2019-05-27 ENCOUNTER — Ambulatory Visit: Payer: 59 | Admitting: Psychiatry

## 2019-06-06 ENCOUNTER — Ambulatory Visit: Payer: 59 | Admitting: Psychiatry

## 2019-06-20 ENCOUNTER — Ambulatory Visit (INDEPENDENT_AMBULATORY_CARE_PROVIDER_SITE_OTHER): Payer: 59 | Admitting: Psychiatry

## 2019-06-20 ENCOUNTER — Encounter: Payer: Self-pay | Admitting: Psychiatry

## 2019-06-20 DIAGNOSIS — F329 Major depressive disorder, single episode, unspecified: Secondary | ICD-10-CM

## 2019-06-20 DIAGNOSIS — F411 Generalized anxiety disorder: Secondary | ICD-10-CM

## 2019-06-20 DIAGNOSIS — F429 Obsessive-compulsive disorder, unspecified: Secondary | ICD-10-CM

## 2019-06-20 DIAGNOSIS — F32A Depression, unspecified: Secondary | ICD-10-CM

## 2019-06-20 MED ORDER — DULOXETINE HCL 40 MG PO CPEP: 40.0000 mg | ORAL_CAPSULE | Freq: Every day | ORAL | 0 refills | Status: DC

## 2019-06-20 MED ORDER — BUSPIRONE HCL 15 MG PO TABS: 15.0000 mg | ORAL_TABLET | Freq: Two times a day (BID) | ORAL | 0 refills | Status: DC

## 2019-06-20 NOTE — Progress Notes (Signed)
Amanda Gilmore [id number] 27-Apr-1976 43 y.o.  Virtual Visit via Telephone Note  I connected with pt on 06/20/19 at  3:15 PM EST by telephone and verified that I am speaking with the correct person using two identifiers.   I discussed the limitations, risks, security and privacy concerns of performing an evaluation and management service by telephone and the availability of in person appointments. I also discussed with the patient that there may be a patient responsible charge related to this service. The patient expressed understanding and agreed to proceed.   I discussed the assessment and treatment plan with the patient. The patient was provided an opportunity to ask questions and all were answered. The patient agreed with the plan and demonstrated an understanding of the instructions.   The patient was advised to call back or seek an in-person evaluation if the symptoms worsen or if the condition fails to improve as anticipated.  I provided 25 minutes of non-face-to-face time during this encounter.  The patient was located at home.  [facility]   Corie ChiquitoJessica Dodi Leu, PMHNP   Subjective:   Patient ID:  Amanda Gilmore is a 43 y.o. (DOB 27-Apr-1976) female.  Chief Complaint:  Chief Complaint  Patient presents with  . Anxiety    HPI Amanda MarkMarla M Gilmore presents for follow-up of anxiety. She reports that she has had increased anxiety, particularly in response to COVID and going places. She reports that fears of COVID have been interfering with function. Reports some obsessive thoughts re: possible COVID exposure. She reports she will occasionally start crying in response to anxiety. Has been compulsively handwashing, cleaning, and decontaminating. She has been trying to avoid watching the news and going on social media since this triggers anxiety. She reports that she has awakened from sleep at times due to anxiety. Reports that she may have had some obsessive  thoughts and compulsions in the past. Occ worry and rumination. Reports that she has been feeling some nausea and feeling "in the pit of my stomach" with anxiety. Notices occ increase in HR. Denies any muscle tension or panic attacks. Denies any depression. Occasional middle of the night awakenings, typically after she has been out of the house and thinking about exposure risks and factors. Reports sleeping 6 hours most nights. Appetite has been good. Energy and motivation have been ok and has recovered from emergency appendectomy. Since that time she has been more consistent with taking vitamins. Concentration has been "so, so." She reports that her mother has commented that her memory has not been as good. Has left the stove on a few times and left the refrigerator door open. Denies SI.   Mother is living with her. Has a 679 yo son.   Reports that she has recently been trying to take Buspar more consistently.    Past Psychiatric Medication Trials: Latuda- Adverse reaction- tingling in hands and SOB Cymbalta- helped with anxiety. "Flat" feeling/affective dulling at 60 mg. Zoloft- heart palpitations Paxil- Cannot recall response Pristiq Viibryd Trintellix Buspar Lamictal  Review of Systems:  Review of Systems  Gastrointestinal: Positive for nausea.       Reports that she has recovered well from appendectomy about 6.5 weeks ago.  Musculoskeletal: Negative for gait problem.  Psychiatric/Behavioral:       Please refer to HPI    Medications: I have reviewed the patient's current medications.  Current Outpatient Medications  Medication Sig Dispense Refill  . busPIRone (BUSPAR) 15 MG tablet Take 1  tablet (15 mg total) by mouth 2 (two) times daily. 180 tablet 0  . Cholecalciferol (VITAMIN D) 50 MCG (2000 UT) CAPS Take by mouth.    . DULoxetine 40 MG CPEP Take 40 mg by mouth daily. 90 capsule 0  . levothyroxine (SYNTHROID) 88 MCG tablet Take 88 mcg by mouth daily.     . Multiple Vitamin  (MULTIVITAMIN) tablet Take 1 tablet by mouth daily.    . Probiotic Product (PROBIOTIC PO) Take 1 tablet by mouth daily.     Marland Kitchen acetaminophen (TYLENOL) 500 MG tablet Take 1,000 mg by mouth every 6 (six) hours as needed for moderate pain or headache.    . traMADol (ULTRAM) 50 MG tablet Take 1 tablet (50 mg total) by mouth every 6 (six) hours as needed (pain not controlled with tylenol and ibuprofen). (Patient not taking: Reported on 06/20/2019) 20 tablet 0   No current facility-administered medications for this visit.     Medication Side Effects: None  Allergies:  Allergies  Allergen Reactions  . Latuda [Lurasidone] Shortness Of Breath    Tingling in hands     Past Medical History:  Diagnosis Date  . Anxiety   . Hypothyroid     Family History  Problem Relation Age of Onset  . CAD Father 47       83  . Hypertension Mother     Social History   Socioeconomic History  . Marital status: Married    Spouse name: Not on file  . Number of children: 1  . Years of education: Not on file  . Highest education level: Not on file  Occupational History  . Not on file  Social Needs  . Financial resource strain: Not on file  . Food insecurity    Worry: Not on file    Inability: Not on file  . Transportation needs    Medical: Not on file    Non-medical: Not on file  Tobacco Use  . Smoking status: Never Smoker  . Smokeless tobacco: Never Used  Substance and Sexual Activity  . Alcohol use: Not on file  . Drug use: Not on file  . Sexual activity: Not on file  Lifestyle  . Physical activity    Days per week: Not on file    Minutes per session: Not on file  . Stress: Not on file  Relationships  . Social Herbalist on phone: Not on file    Gets together: Not on file    Attends religious service: Not on file    Active member of club or organization: Not on file    Attends meetings of clubs or organizations: Not on file    Relationship status: Not on file  . Intimate  partner violence    Fear of current or ex partner: Not on file    Emotionally abused: Not on file    Physically abused: Not on file    Forced sexual activity: Not on file  Other Topics Concern  . Not on file  Social History Narrative   Lives at home with husband and son.    Past Medical History, Surgical history, Social history, and Family history were reviewed and updated as appropriate.   Please see review of systems for further details on the patient's review from today.   Objective:   Physical Exam:  There were no vitals taken for this visit.  Physical Exam Neurological:     Mental Status: She is alert and oriented to  person, place, and time.     Cranial Nerves: No dysarthria.  Psychiatric:        Attention and Perception: Attention normal.        Mood and Affect: Mood is anxious.        Speech: Speech normal.        Behavior: Behavior is cooperative.        Thought Content: Thought content normal. Thought content is not paranoid or delusional. Thought content does not include homicidal or suicidal ideation. Thought content does not include homicidal or suicidal plan.        Cognition and Memory: Cognition and memory normal.        Judgment: Judgment normal.     Comments: Obsessive thoughts, rumination. Insight intact     Lab Review:     Component Value Date/Time   NA 137 05/02/2019 1631   K 4.6 05/02/2019 1631   CL 102 05/02/2019 1631   CO2 23 05/02/2019 1631   GLUCOSE 97 05/02/2019 1631   BUN 7 05/02/2019 1631   CREATININE 0.80 05/02/2019 1631   CALCIUM 9.3 05/02/2019 1631   PROT 7.5 05/02/2019 1631   ALBUMIN 4.4 05/02/2019 1631   AST 21 05/02/2019 1631   ALT 26 05/02/2019 1631   ALKPHOS 34 (L) 05/02/2019 1631   BILITOT 0.8 05/02/2019 1631   GFRNONAA >60 05/02/2019 1631   GFRAA >60 05/02/2019 1631       Component Value Date/Time   WBC 19.7 (H) 05/03/2019 0329   RBC 4.93 05/03/2019 0329   HGB 13.9 05/03/2019 0329   HCT 42.7 05/03/2019 0329   PLT  285 05/03/2019 0329   MCV 86.6 05/03/2019 0329   MCH 28.2 05/03/2019 0329   MCHC 32.6 05/03/2019 0329   RDW 12.8 05/03/2019 0329   LYMPHSABS 0.8 05/03/2019 0329   MONOABS 0.1 05/03/2019 0329   EOSABS 0.0 05/03/2019 0329   BASOSABS 0.0 05/03/2019 0329    No results found for: POCLITH, LITHIUM   No results found for: PHENYTOIN, PHENOBARB, VALPROATE, CBMZ   .res Assessment: Plan:   Patient seen for 30 minutes and greater than 50% of visit spent counseling patient and coordination of care to include addressing her questions and concerns about seeing a therapist regarding obsessive thoughts and compulsions.  Provided patient with referrals regarding possible therapists.  Discussed option of increasing Cymbalta back to 60 mg daily, even if short-term, to help with acute anxiety, however patient reports that she would prefer to continue to try to take BuSpar more consistently and to start therapy.  Advised that patient should call office if anxiety signs and symptoms worsen and/or she decides that an increase in Cymbalta would be helpful. Patient to follow-up in 4 weeks or sooner if clinically indicated. Patient advised to contact office with any questions, adverse effects, or acute worsening in signs and symptoms.  Cathern was seen today for anxiety.  Diagnoses and all orders for this visit:  Generalized anxiety disorder -     busPIRone (BUSPAR) 15 MG tablet; Take 1 tablet (15 mg total) by mouth 2 (two) times daily. -     DULoxetine 40 MG CPEP; Take 40 mg by mouth daily.    Please see After Visit Summary for patient specific instructions.  Future Appointments  Date Time Provider Department Center  07/16/2019  5:10 PM [facility]    No orders of the defined types were placed in this encounter.     -------------------------------

## 2019-07-04 ENCOUNTER — Other Ambulatory Visit: Payer: Self-pay | Admitting: Psychiatry

## 2019-07-04 DIAGNOSIS — F411 Generalized anxiety disorder: Secondary | ICD-10-CM

## 2019-07-15 ENCOUNTER — Other Ambulatory Visit: Payer: Self-pay

## 2019-07-15 ENCOUNTER — Encounter: Payer: Self-pay | Admitting: Psychiatry

## 2019-07-15 ENCOUNTER — Ambulatory Visit (INDEPENDENT_AMBULATORY_CARE_PROVIDER_SITE_OTHER): Payer: 59 | Admitting: Psychiatry

## 2019-07-15 DIAGNOSIS — F411 Generalized anxiety disorder: Secondary | ICD-10-CM | POA: Diagnosis not present

## 2019-07-15 NOTE — Progress Notes (Signed)
[facility] Counselor Initial Adult Exam  Name: Amanda Gilmore Date: 07/15/2019 MRN: [id number] DOB: 1976-06-07 PCP: Amanda Gilmore, Walter, MD  Time spent: 58 minutes   Guardian/Payee:  None    Paperwork requested:  No   Reason for Visit /Presenting Problem: This is a 43 year old married white female who reports issues with ongoing anxiety.  She states that she has anxiety around work and the current pandemic issues.  She states that the fear of the COVID-19 virus causes her the most anxiety.  Her mother lives with her who is immunocompromised.  She is fearful that she will unknowingly be exposed and make her mother sick.  Her thought process is, "how close to did I stand?  How long did I talk to them?  Was I being reckless?.  All of this conveys a level of uncertainty and powerlessness to the client.  The client states that this all started in earnest at the beginning of the pandemic  Goals: 1) decrease anxiety around work issues. 2) decrease anxiety around the COVID-19 pandemic.  I discussed the use of eye-movement desensitization and reprocessing with the client as a way to help restructure her negative thinking and reduce her overall anxiety.  She agreed to treatment.  Today we focused using eye-movement on the COVID-19.  Her negative cognition is, "what if?"  She feels anxiety all over her body.  Her subjective units of distress is a 6+.  As the client processed she remembered a time where she was out with her mother and stopped at a car wash.  Her mother insisted that the client vacuum out her car.  She pulled to the last vacuum station and did so.  Afterwards she started to have major anxiety thinking about being at the car wash.  "I did not wear my mask.  Was I reckless and put my mom in danger?"  As the client continued to process her anxiety went up to a 9+.  I asked the client if she trusts her judgment?  She said she really does not when it comes to the pandemic.  The more she talked, the more  I realized she does not have a good understanding of how the virus works or what it takes to get infected.  I asked the client since the pandemic is started if anyone in her immediate family has gotten sick?  She stated no.  I pointed out to the client and then she must be doing something right but she had a hard time accepting that.  She states that she tries to pray about it all the time but still feels anxious.  I switched to the bilateral stimulation hand paddles with the client.  The client did a visualization of being on the beach with Jesus.  She turned over all her fear and anxiety to him about the pandemic.  She stated, "he told me it would be all right."  As she said that her anxiety dropped to below 4.  We were able to install the cognition, "I can do the right thing."  Clearly the client has more work to do around trusting her judgment and self-confidence.  Mental Status Exam:   Appearance:   Well Groomed     Behavior:  Appropriate  Motor:  Normal  Speech/Language:   Clear and Coherent  Affect:  Appropriate  Mood:  anxious  Thought process:  normal  Thought content:    Rumination  Sensory/Perceptual disturbances:    WNL  Orientation:  oriented to  person, place, time/date and situation  Attention:  Good  Concentration:  Good  Memory:  WNL  Fund of knowledge:   Good  Insight:    Good  Judgment:   Good  Impulse Control:  Good   Reported Symptoms:  Anxiety, negative thoughts  Risk Assessment: Danger to Self:  No Self-injurious Behavior: No Danger to Others: No Duty to Warn:no Physical Aggression / Violence:No  Access to Firearms a concern: No  Gang Involvement:No  Patient / guardian was educated about steps to take if suicide or homicide risk level increases between visits: yes While future psychiatric events cannot be accurately predicted, the patient does not currently require acute inpatient psychiatric care and does not currently meet [facility] involuntary commitment  criteria.  Substance Abuse History: Current substance abuse: No     Past Psychiatric History:   No previous psychological problems have been observed Outpatient Providers:None History of Psych Hospitalization: No  Psychological Testing: None  Abuse History: Victim of No., None   Report needed: No. Victim of Neglect:No. Perpetrator of NA  Witness / Exposure to Domestic Violence: No   Protective Services Involvement: No  Witness to [employer] Violence:  No   Family History:  Family History  Problem Relation Age of Onset  . CAD Father 70       83  . Hypertension Mother     Living situation: the patient lives with their family  Sexual Orientation:  Straight  Relationship Status: married  Name of spouse / other:unknown             If a parent, number of children / Patrick; significant other friends  Financial Stress:  No   Income/Employment/Disability: Employment  Armed forces logistics/support/administrative officer: No   Educational History: Education: Scientist, product/process development:   [employer]  Any cultural differences that Philamena Kramar affect / interfere with treatment:  not applicable   Recreation/Hobbies: reading  Stressors:Other: fear of COVID-19  Strengths:  Supportive Relationships  Barriers:  None   Legal History: Pending legal issue / charges: The patient has no significant history of legal issues. History of legal issue / charges: NA  Medical History/Surgical History:not reviewed Past Medical History:  Diagnosis Date  . Anxiety   . Hypothyroid     Past Surgical History:  Procedure Laterality Date  . LAPAROSCOPIC APPENDECTOMY N/A 05/02/2019   Procedure: APPENDECTOMY LAPAROSCOPIC;  Surgeon: Ileana Roup, MD;  Location: [facility] OR;  Service: General;  Laterality: N/A;    Medications: Current Outpatient Medications  Medication Sig Dispense Refill  . acetaminophen (TYLENOL) 500 MG tablet Take 1,000 mg by mouth every 6 (six) hours as  needed for moderate pain or headache.    . busPIRone (BUSPAR) 15 MG tablet Take 1 tablet (15 mg total) by mouth 2 (two) times daily. 180 tablet 0  . Cholecalciferol (VITAMIN D) 50 MCG (2000 UT) CAPS Take by mouth.    . DULoxetine 40 MG CPEP Take 40 mg by mouth daily. 90 capsule 0  . levothyroxine (SYNTHROID) 88 MCG tablet Take 88 mcg by mouth daily.     . Multiple Vitamin (MULTIVITAMIN) tablet Take 1 tablet by mouth daily.    . Probiotic Product (PROBIOTIC PO) Take 1 tablet by mouth daily.     . traMADol (ULTRAM) 50 MG tablet Take 1 tablet (50 mg total) by mouth every 6 (six) hours as needed (pain not controlled with tylenol and ibuprofen). (Patient not taking: Reported on 06/20/2019) 20 tablet 0   No current  facility-administered medications for this visit.    Allergies  Allergen Reactions  . Latuda [Lurasidone] Shortness Of Breath    Tingling in hands     Diagnoses:    ICD-10-CM   1. Generalized anxiety disorder  F41.1     Plan of Care: Use of EMDR to restructure negative thinking and reduce negative emotional content.  Develop skills acquisition and necessary areas.  Develop problem solving skills.   Gelene Mink Rogue Rafalski, [facility]

## 2019-07-16 ENCOUNTER — Ambulatory Visit: Payer: Managed Care, Other (non HMO)

## 2019-07-21 ENCOUNTER — Other Ambulatory Visit: Payer: Self-pay

## 2019-07-21 ENCOUNTER — Ambulatory Visit
Admission: RE | Admit: 2019-07-21 | Discharge: 2019-07-21 | Disposition: A | Discharge status: Home / Self Care | Payer: Managed Care, Other (non HMO) | Source: Ambulatory Visit | Attending: Internal Medicine | Admitting: Internal Medicine

## 2019-07-21 DIAGNOSIS — Z1231 Encounter for screening mammogram for malignant neoplasm of breast: Secondary | ICD-10-CM

## 2019-08-05 ENCOUNTER — Other Ambulatory Visit: Payer: Self-pay

## 2019-08-05 ENCOUNTER — Encounter: Payer: Self-pay | Admitting: Psychiatry

## 2019-08-05 ENCOUNTER — Ambulatory Visit (INDEPENDENT_AMBULATORY_CARE_PROVIDER_SITE_OTHER): Payer: 59 | Admitting: Psychiatry

## 2019-08-05 DIAGNOSIS — F411 Generalized anxiety disorder: Secondary | ICD-10-CM | POA: Diagnosis not present

## 2019-08-05 NOTE — Progress Notes (Signed)
[facility] Counselor/Therapist Progress Note  Patient ID: JASHAY RODDY, MRN: [id number],    Date: 08/05/2019  Time Spent: 50 minutes   Treatment Type: Individual Therapy  Reported Symptoms: anxiety  Mental Status Exam:  Appearance:   Well Groomed     Behavior:  Appropriate  Motor:  Normal  Speech/Language:   Clear and Coherent  Affect:  Appropriate  Mood:  anxious  Thought process:  normal  Thought content:    Rumination  Sensory/Perceptual disturbances:    WNL  Orientation:  oriented to person, place, time/date and situation  Attention:  Good  Concentration:  Good  Memory:  WNL  Fund of knowledge:   Good  Insight:    Good  Judgment:   Good  Impulse Control:  Good   Risk Assessment: Danger to Self:  No Self-injurious Behavior: No Danger to Others: No Duty to Warn:no Physical Aggression / Violence:No  Access to Firearms a concern: No  Gang Involvement:No    Subjective: "My anxiety has gotten so bad that I have had head tremors."  The client states that she is ruminating on COVID-19.  "When did I start not trusting myself?"  The client states that she is either living in the past or in the future.  When that occurs she is in a catastrophic thought process which causes her severe anxiety.  I explained to the client that when she does that she feels terrible but in the present tense nothing is happened.  We discussed that the only thing that matters is the present moment.  What will happen we do not have much control over.  What has happened is done.  I provided the client with a handout on mindfulness practices to begin to use.  I explained that using these every day multiple times a day over a period of time helps her to learn the skill of becoming present. Today we used eye-movement focused on the client's catastrophic thoughts in the past and future.  Her subjective units of distress is a 9.  She feels it in the upper part of her body.  Her negative thought is, "I  feels so uncertain about the future."  The client states that she is also upset about how her extended family is responding to her fear of COVID-19.  She states her 2 older sisters are dismissive which undermines her own judgment.  As the client continue to process she was able to shift to, "I can start to trust my judgment."  We discussed her crafting email to her sisters explaining how much the fear of COVID-19 has crippled her mental health and asking for some grace.  The client felt like this was a good idea.  Her anxiety did not decrease much down to a 6+ but her cognitions did become more positive.  She also was worried about her work that she was not doing a good enough job.  I pointed out to the client that she has been doing that work for many years and has a wealth of wisdom and experience.  She agreed.  She concluded, "my work does not have to define me."  At the end of the session the client's subjective units of distress was only around 6 but her positive cognition was, "I can start to trust my judgment."  I gave the client a handout on positive self talk for her to review.    Interventions: Assertiveness/Communication, Mindfulness Meditation, Motivational Interviewing, Solution-Oriented/Positive Psychology, [employer] Desensitization and  Reprocessing (EMDR) and Insight-Oriented  Diagnosis:   ICD-10-CM   1. Generalized anxiety disorder  F41.1     Plan: Letter to siblings, mood independent behavior, positive self talk, mindfulness, assertiveness, boundaries.  Elva Breaker, [facility]

## 2019-08-21 ENCOUNTER — Encounter: Payer: Self-pay | Admitting: Psychiatry

## 2019-08-21 ENCOUNTER — Other Ambulatory Visit: Payer: Self-pay

## 2019-08-21 ENCOUNTER — Ambulatory Visit (INDEPENDENT_AMBULATORY_CARE_PROVIDER_SITE_OTHER): Payer: 59 | Admitting: Psychiatry

## 2019-08-21 DIAGNOSIS — F411 Generalized anxiety disorder: Secondary | ICD-10-CM | POA: Diagnosis not present

## 2019-08-21 NOTE — Progress Notes (Signed)
      [facility] Counselor/Therapist Progress Note  Patient ID: CHARLANE WESTRY, MRN: [id number],    Date: 08/21/2019  Time Spent: 50 minutes   Treatment Type: Individual Therapy  Reported Symptoms: anxiety  Mental Status Exam:  Appearance:   Casual     Behavior:  Appropriate  Motor:  Normal  Speech/Language:   Clear and Coherent  Affect:  Appropriate  Mood:  anxious  Thought process:  normal  Thought content:    WNL  Sensory/Perceptual disturbances:    WNL  Orientation:  oriented to person, place, time/date and situation  Attention:  Good  Concentration:  Good  Memory:  WNL  Fund of knowledge:   Good  Insight:    Good  Judgment:   Good  Impulse Control:  Good   Risk Assessment: Danger to Self:  No Self-injurious Behavior: No Danger to Others: No Duty to Warn:no Physical Aggression / Violence:No  Access to Firearms a concern: No  Gang Involvement:No   Subjective: The client states that she is making progress and feels like she has gotten a little traction.  She discussed being able to go when to [employer] on her own using mood independent behavior.  We also discussed thought redirection which the client describes doing successfully when her thoughts were to go down the negative rabbit hole. Today the client wanted to focus on her fear of COVID-19.  Her negative cognition is, "I will get infected.  It is the unknown."  Her subjective units of distress is a 7+.  As the client processed, we were able to neutralize a lot of her fear with truthful statements.  For example the client realizes that she is at a good age to be able to recover from COVID-19.  She takes zinc and vitamin D3.  She also takes vitamin C and uses universal precautions.  The client is also diligent about wearing her mask.  I discussed with the client of writing out to herself the truth with some of the concerns that she has.  Then reading those truths out loud to herself when she is having that negative thought  process.  The client agreed to try this.  She has an eye exam coming up that she is very nervous about because of the fear of infection.  I discussed with the client of calling the receptionist to see if there were things that she could do that they would be okay with such as putting a tissue between her forehead and the machine.  The client felt like she could do this and was positive that they would respond well.  Her positive cognition was, "I can do this."  Her subjective units of distress only went down to a 5.  Interventions: Assertiveness/Communication, Mindfulness Meditation, Motivational Interviewing, Solution-Oriented/Positive Psychology, [employer] Desensitization and Reprocessing (EMDR) and Insight-Oriented  Diagnosis:   ICD-10-CM   1. Generalized anxiety disorder  F41.1     Plan: Mindfulness, mood independent behavior, positive self talk, telling herself the truth, assertiveness, boundaries, use of universal precautions.  Gelene Mink Keyshia Orwick, [facility]

## 2019-09-01 ENCOUNTER — Other Ambulatory Visit: Payer: Self-pay

## 2019-09-01 ENCOUNTER — Encounter: Payer: Self-pay | Admitting: Psychiatry

## 2019-09-01 ENCOUNTER — Ambulatory Visit (INDEPENDENT_AMBULATORY_CARE_PROVIDER_SITE_OTHER): Payer: 59 | Admitting: Psychiatry

## 2019-09-01 DIAGNOSIS — F411 Generalized anxiety disorder: Secondary | ICD-10-CM | POA: Diagnosis not present

## 2019-09-01 NOTE — Progress Notes (Signed)
      [facility] Counselor/Therapist Progress Note  Patient ID: Amanda Gilmore, MRN: [id number],    Date: 09/01/2019  Time Spent: 50 minutes   Treatment Type: Individual Therapy  Reported Symptoms: anxiety, rumination  Mental Status Exam:  Appearance:   Casual     Behavior:  Appropriate  Motor:  Normal  Speech/Language:   Clear and Coherent  Affect:  Appropriate  Mood:  anxious  Thought process:  normal  Thought content:    Rumination  Sensory/Perceptual disturbances:    WNL  Orientation:  oriented to person, place, time/date and situation  Attention:  Good  Concentration:  Good  Memory:  WNL  Fund of knowledge:   Good  Insight:    Good  Judgment:   Good  Impulse Control:  Good   Risk Assessment: Danger to Self:  No Self-injurious Behavior: No Danger to Others: No Duty to Warn:no Physical Aggression / Violence:No  Access to Firearms a concern: No  Gang Involvement:No   Subjective: The client states that she has done a little bit better since last session.  She described an issue with her mailman.  She had taken her dog into her front yard on the leash to use the restroom.  The mailman drove up but did not have a mask on.  He held the mail out for the client to take and internally she said, "I freaked out."  She told him just to put it in the mailbox.  She stated that she spent the next few hours talking herself down from that episode.  She was able to be successful.  I commended the client on using that skill successfully.  She asked specifically how she could work more on her rumination.  I discussed the mindfulness skills that I had presented to her before.  She stated that the belly breathing exercise worked well for her during the episode with the mailman.  I suggested to her that she should practice a couple of those skills twice a day for a few minutes and persist with that for at least 30 days.  The goal would be to make it almost automatic.  We also discussed thought  stopping behavior to prevent her thoughts from spiraling out of control.  Another techniques she could use is to write out what she is ruminating about.  Then ask herself the question, "is this true?  How can I change it?"  Today I used eye-movement with the client around the negative thoughts, "what was I thinking?  Why did you do that?"  As the client processed her subjective units of distress went from a 4+ to less than 2.  She will also use this with the journaling technique.  Interventions: Assertiveness/Communication, Mindfulness Meditation, Motivational Interviewing, Solution-Oriented/Positive Psychology, [employer] Desensitization and Reprocessing (EMDR) and Insight-Oriented  Diagnosis:   ICD-10-CM   1. Generalized anxiety disorder  F41.1     Plan: Thought stopping, positive self talk, journaling, mindfulness, assertiveness, boundaries, self-care.  Gelene Mink Adom Schoeneck, [facility]

## 2019-09-04 ENCOUNTER — Ambulatory Visit: Payer: 59 | Admitting: Psychiatry

## 2019-09-18 ENCOUNTER — Ambulatory Visit: Payer: 59 | Admitting: Psychiatry

## 2019-10-02 ENCOUNTER — Other Ambulatory Visit: Payer: Self-pay

## 2019-10-02 ENCOUNTER — Ambulatory Visit (INDEPENDENT_AMBULATORY_CARE_PROVIDER_SITE_OTHER): Payer: 59 | Admitting: Psychiatry

## 2019-10-02 ENCOUNTER — Encounter: Payer: Self-pay | Admitting: Psychiatry

## 2019-10-02 DIAGNOSIS — F411 Generalized anxiety disorder: Secondary | ICD-10-CM

## 2019-10-02 NOTE — Progress Notes (Signed)
[facility] Counselor/Therapist Progress Note  Patient ID: Amanda Gilmore, MRN: [id number],    Date: 10/02/2019  Time Spent: 50 minutes   Treatment Type: Individual Therapy  Reported Symptoms: anxiety  Mental Status Exam:  Appearance:   Well Groomed     Behavior:  Appropriate  Motor:  Normal  Speech/Language:   Clear and Coherent  Affect:  Appropriate  Mood:  anxious  Thought process:  normal  Thought content:    Rumination  Sensory/Perceptual disturbances:    WNL  Orientation:  oriented to person, place, time/date and situation  Attention:  Good  Concentration:  Good  Memory:  WNL  Fund of knowledge:   Good  Insight:    Good  Judgment:   Good  Impulse Control:  Good   Risk Assessment: Danger to Self:  No Self-injurious Behavior: No Danger to Others: No Duty to Warn:no Physical Aggression / Violence:No  Access to Firearms a concern: No  Gang Involvement:No   Subjective: The client stated that she has done "okay" since last time.  She had trouble when some family members came by unexpectedly to see her mom.  She was concerned about exposure to the COVID-19 virus.  Even though everything turned out well it bothered her nonetheless.  The client also reports that her long-term memory is, "really bad".  I suggested to the client some supplements that have been clinically proven to help with memory.  These are ginkgo biloba, NAC, and B vitamin with methyl folate.  The client also states that she has had some success at being able to redirect her thoughts. Today the client wanted to work on her motivation.  She is currently taking duloxetine at 40 mg.  She states that she is able to do things for her family and for work but not for herself.  "I want to care."  One of the things that she would like to see happen is get up earlier in the morning.  She states, "I just do not have the motivation to do it."  The client states that she needs about 7 hours sleep.  She is not always  able to get that because her mother calls her or her husband's noisy when he gets up earlier than her.  I discussed with the client what she can do to increase her motivation to get up?  She knows she would like to.  I gave the client the morning evening questionnaire to help her determine the most ideal time for her to get up.  She could also use a light box to help improve her mood.  We discussed that she could use mood independent behavior and try to do it 2 days in a row and then reward herself.  She thought [employer] would be great as a reward.  She could also partner with a girlfriend.  Either one of them would call the other and if they did not get back to them in 15 minutes they owed the other one a [employer].  We also talked about what she could do during that early morning time such as pray, read and enjoy her coffee.  The client stated she felt more excited about this.  I used eye-movement to help in bed this more deeply with the client.  She found her lack of motivation decreasing and her motivation increasing.  She will try to do as we discussed.   Interventions: Assertiveness/Communication, Motivational Interviewing, Solution-Oriented/Positive Psychology, Psycho-education/Bibliotherapy, Comptroller and Reprocessing (  EMDR) and Insight-Oriented  Diagnosis:   ICD-10-CM   1. Generalized anxiety disorder  F41.1     Plan: Morning evening questionnaire, mood independent behavior, self-care, positive self talk, partner with friend, use a reward system, light box.  Gelene Mink Abdo Denault, [facility]

## 2019-10-21 ENCOUNTER — Ambulatory Visit: Payer: 59 | Admitting: Psychiatry

## 2019-11-07 ENCOUNTER — Other Ambulatory Visit: Payer: Self-pay | Admitting: Psychiatry

## 2019-11-07 DIAGNOSIS — F411 Generalized anxiety disorder: Secondary | ICD-10-CM

## 2019-11-08 NOTE — Telephone Encounter (Signed)
Last apt 06/2019 due back 4 weeks 

## 2019-11-11 ENCOUNTER — Ambulatory Visit: Payer: 59 | Admitting: Psychiatry

## 2019-12-19 ENCOUNTER — Telehealth: Payer: Managed Care, Other (non HMO) | Admitting: Cardiology

## 2019-12-24 ENCOUNTER — Telehealth: Payer: Self-pay | Admitting: Cardiology

## 2019-12-24 NOTE — Telephone Encounter (Signed)
I attempted to contact the patient 12/24/19 to schedule follow up visit with Dr Cristal Deer from patients recall list. The patient didn't answer so I left message for the patient to return call to get that appointment scheduled.

## 2019-12-31 ENCOUNTER — Telehealth: Payer: Self-pay | Admitting: Cardiology

## 2019-12-31 NOTE — Telephone Encounter (Signed)
Spoke with patient about scheduling 1 year f/u appt with Dr. Cristal Deer she stated she didn't want to schedule since she wasn't having any problems. Said she would call if anything changed..... AF

## 2020-01-05 ENCOUNTER — Other Ambulatory Visit: Payer: Self-pay | Admitting: Psychiatry

## 2020-01-05 DIAGNOSIS — F411 Generalized anxiety disorder: Secondary | ICD-10-CM

## 2020-01-27 ENCOUNTER — Ambulatory Visit (INDEPENDENT_AMBULATORY_CARE_PROVIDER_SITE_OTHER): Payer: 59 | Admitting: Psychiatry

## 2020-01-27 ENCOUNTER — Other Ambulatory Visit: Payer: Self-pay

## 2020-01-27 ENCOUNTER — Encounter: Payer: Self-pay | Admitting: Psychiatry

## 2020-01-27 DIAGNOSIS — F32A Depression, unspecified: Secondary | ICD-10-CM

## 2020-01-27 DIAGNOSIS — F411 Generalized anxiety disorder: Secondary | ICD-10-CM

## 2020-01-27 DIAGNOSIS — F329 Major depressive disorder, single episode, unspecified: Secondary | ICD-10-CM | POA: Diagnosis not present

## 2020-01-27 MED ORDER — DULOXETINE HCL 40 MG PO CPEP: 40.0000 mg | ORAL_CAPSULE | Freq: Every day | ORAL | 0 refills | Status: DC

## 2020-01-27 MED ORDER — BUSPIRONE HCL 15 MG PO TABS: 15.0000 mg | ORAL_TABLET | Freq: Two times a day (BID) | ORAL | 0 refills | Status: DC

## 2020-01-27 MED ORDER — BUPROPION HCL ER (XL) 150 MG PO TB24: 150.0000 mg | ORAL_TABLET | Freq: Every day | ORAL | 1 refills | Status: DC

## 2020-01-27 NOTE — Progress Notes (Signed)
Amanda Gilmore [id number] Aug 07, 1975 44 y.o.  Subjective:   Patient ID:  Amanda Gilmore is a 44 y.o. (DOB August 24, 1975) female.  Chief Complaint:  Chief Complaint  Patient presents with  . Other    Impaired concentration    HPI Amanda Gilmore presents to the office today for follow-up of anxiety. She has started seeing Fred May, [facility] for anxiety. She reports that her anxiety has been "ok." She reports that her work is the main source of her anxiety. She reports that obsessive thoughts have decreased. She reports that worry has been ok aside from her work. Denies any physical s/s with anxiety. Describes her mood as "so,so." She reports that her sister recently commented that pt seems "flat." Pt reports that she notices some slight affective dulling- "I don't get a lot of emotion." Reports that she does not experience any intense emotions. Sleep is disrupted due to awakening 1-2 times a night to assist her mother. Stays up late and wakes up early to do work. Averages about 5-6 hours a night when caring for her mother. Has been able to sleep more while mother has been staying with sister. She reports, "I eat my feelings" and eats when stressed. Energy and motivation have been fair. She reports poor focus and concentration. She reports difficulty completing tasks and is easily distracted. She reports that this has been long-standing and is interfering with her work. She noticed that she had difficulty focusing and completing one work task yesterday despite no one being home. Denies difficulty with concentration or focus while in school. Some increased difficulty with multi-tasking. She reports some decrease in interests which she attributes to the pandemic. Denies anhedonia. Has been enjoying taking care of her yard. Denies SI.   Has been working remotely and may return to the office in the fall. Child is at home for the summer and was home schooling last year and will return to school in the fall. Mother  has been living with them and has been with pt's sister for a few weeks.   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   Past Psychiatric Medication Trials: Latuda- Adverse reaction- tingling in hands and SOB Cymbalta- helped with anxiety. "Flat" feeling/affective dullingat 60 mg. Zoloft- heart palpitations Paxil- Cannot recall response Pristiq Viibryd Trintellix Buspar Lamictal    Review of Systems:  Review of Systems  Gastrointestinal: Negative.   Musculoskeletal: Negative for gait problem.  Neurological: Positive for headaches. Negative for tremors.       Headaches seem to be menstrual related  Psychiatric/Behavioral:       Please refer to HPI    Medications: I have reviewed the patient's current medications.  Current Outpatient Medications  Medication Sig Dispense Refill  . acetaminophen (TYLENOL) 500 MG tablet Take 1,000 mg by mouth every 6 (six) hours as needed for moderate pain or headache.    . Cholecalciferol (VITAMIN D) 50 MCG (2000 UT) CAPS Take by mouth.    . levothyroxine (SYNTHROID) 88 MCG tablet Take 88 mcg by mouth daily.     . Multiple Vitamin (MULTIVITAMIN) tablet Take 1 tablet by mouth daily.    . Probiotic Product (PROBIOTIC PO) Take 1 tablet by mouth daily.     Marland Kitchen buPROPion (WELLBUTRIN XL) 150 MG 24 hr tablet Take 1 tablet (150 mg total) by mouth daily. 30 tablet 1  . busPIRone (BUSPAR) 15 MG tablet Take 1 tablet (15 mg total) by mouth 2 (two) times daily. 180 tablet 0  .  DULoxetine HCl 40 MG CPEP Take 40 mg by mouth daily. 90 capsule 0   No current facility-administered medications for this visit.    Medication Side Effects: Other: Mild affective dulling  Allergies:  Allergies  Allergen Reactions  . Latuda [Lurasidone] Shortness Of Breath    Tingling in hands     Past Medical History:  Diagnosis Date  . Anxiety   . Hypothyroid     Family History  Problem Relation Age of Onset  . CAD Father 51       38  . Hypertension Mother     Social  History   Socioeconomic History  . Marital status: Married    Spouse name: Not on file  . Number of children: 1  . Years of education: Not on file  . Highest education level: Not on file  Occupational History  . Not on file  Tobacco Use  . Smoking status: Never Smoker  . Smokeless tobacco: Never Used  Substance and Sexual Activity  . Alcohol use: Not on file  . Drug use: Not on file  . Sexual activity: Not on file  Other Topics Concern  . Not on file  Social History Narrative   Lives at home with husband and son.   Social Determinants of Health   Financial Resource Strain:   . Difficulty of Paying Living Expenses:   Food Insecurity:   . Worried About Programme researcher, broadcasting/film/video in the Last Year:   . Barista in the Last Year:   Transportation Needs:   . Freight forwarder (Medical):   Marland Kitchen Lack of Transportation (Non-Medical):   Physical Activity:   . Days of Exercise per Week:   . Minutes of Exercise per Session:   Stress:   . Feeling of Stress :   Social Connections:   . Frequency of Communication with Friends and Family:   . Frequency of Social Gatherings with Friends and Family:   . Attends Religious Services:   . Active Member of Clubs or Organizations:   . Attends Banker Meetings:   Marland Kitchen Marital Status:   Intimate Partner Violence:   . Fear of Current or Ex-Partner:   . Emotionally Abused:   Marland Kitchen Physically Abused:   . Sexually Abused:     Past Medical History, Surgical history, Social history, and Family history were reviewed and updated as appropriate.   Please see review of systems for further details on the patient's review from today.   Objective:   Physical Exam:  There were no vitals taken for this visit.  Physical Exam Constitutional:      General: She is not in acute distress. Musculoskeletal:        General: No deformity.  Neurological:     Mental Status: She is alert and oriented to person, place, and time.     Coordination:  Coordination normal.  Psychiatric:        Attention and Perception: Attention and perception normal. She does not perceive auditory or visual hallucinations.        Mood and Affect: Mood normal. Mood is not anxious or depressed. Affect is not labile, blunt, angry or inappropriate.        Speech: Speech normal.        Behavior: Behavior normal.        Thought Content: Thought content normal. Thought content is not paranoid or delusional. Thought content does not include homicidal or suicidal ideation. Thought content does not include homicidal  or suicidal plan.        Cognition and Memory: Cognition and memory normal.        Judgment: Judgment normal.     Comments: Insight intact     Lab Review:     Component Value Date/Time   NA 137 05/02/2019 1631   K 4.6 05/02/2019 1631   CL 102 05/02/2019 1631   CO2 23 05/02/2019 1631   GLUCOSE 97 05/02/2019 1631   BUN 7 05/02/2019 1631   CREATININE 0.80 05/02/2019 1631   CALCIUM 9.3 05/02/2019 1631   PROT 7.5 05/02/2019 1631   ALBUMIN 4.4 05/02/2019 1631   AST 21 05/02/2019 1631   ALT 26 05/02/2019 1631   ALKPHOS 34 (L) 05/02/2019 1631   BILITOT 0.8 05/02/2019 1631   GFRNONAA >60 05/02/2019 1631   GFRAA >60 05/02/2019 1631       Component Value Date/Time   WBC 19.7 (H) 05/03/2019 0329   RBC 4.93 05/03/2019 0329   HGB 13.9 05/03/2019 0329   HCT 42.7 05/03/2019 0329   PLT 285 05/03/2019 0329   MCV 86.6 05/03/2019 0329   MCH 28.2 05/03/2019 0329   MCHC 32.6 05/03/2019 0329   RDW 12.8 05/03/2019 0329   LYMPHSABS 0.8 05/03/2019 0329   MONOABS 0.1 05/03/2019 0329   EOSABS 0.0 05/03/2019 0329   BASOSABS 0.0 05/03/2019 0329    No results found for: POCLITH, LITHIUM   No results found for: PHENYTOIN, PHENOBARB, VALPROATE, CBMZ   .res Assessment: Plan:   Discussed that concentration difficulties may be in response to stressors and managing multiple responsibilities, and/or chronic sleep disturbance related to caregiving  responsibilities and having to stay up later and wake up earlier to complete her work.  Discussed possible treatment options for impaired concentration.  Discussed potential benefits, risks, and side effects of Wellbutrin XL and modafinil.  Patient agrees to trial of Wellbutrin XL.  Will start Wellbutrin XL 150 mg daily to improve concentration, low energy, and low motivation.  Discussed that Wellbutrin XL may also be helpful for emotional eating. Continue Cymbalta 40 mg daily for anxiety and depression. Continue BuSpar 15 mg twice daily for anxiety. Patient to follow-up in 4 to 6 weeks or sooner if clinically indicated. Patient advised to contact office with any questions, adverse effects, or acute worsening in signs and symptoms.  Amanda Gilmore was seen today for other.  Diagnoses and all orders for this visit:  Depression, unspecified depression type -     buPROPion (WELLBUTRIN XL) 150 MG 24 hr tablet; Take 1 tablet (150 mg total) by mouth daily.  Generalized anxiety disorder -     busPIRone (BUSPAR) 15 MG tablet; Take 1 tablet (15 mg total) by mouth 2 (two) times daily. -     DULoxetine HCl 40 MG CPEP; Take 40 mg by mouth daily.     Please see After Visit Summary for patient specific instructions.  No future appointments.  No orders of the defined types were placed in this encounter.   -------------------------------

## 2020-02-23 ENCOUNTER — Telehealth: Payer: Self-pay | Admitting: Psychiatry

## 2020-02-23 NOTE — Telephone Encounter (Signed)
Left very detailed message with information and to call back to discuss or set up apt.

## 2020-02-23 NOTE — Telephone Encounter (Signed)
She saw that Wellbutrin has a high drug interaction with her Cymbalta.  She is not comfortable starting the Wellbutrin since she saw this.

## 2020-03-02 ENCOUNTER — Other Ambulatory Visit: Payer: Self-pay | Admitting: Psychiatry

## 2020-03-02 DIAGNOSIS — F329 Major depressive disorder, single episode, unspecified: Secondary | ICD-10-CM

## 2020-03-02 DIAGNOSIS — F32A Depression, unspecified: Secondary | ICD-10-CM

## 2020-04-04 ENCOUNTER — Other Ambulatory Visit: Payer: Self-pay | Admitting: Psychiatry

## 2020-04-04 DIAGNOSIS — F411 Generalized anxiety disorder: Secondary | ICD-10-CM

## 2020-07-13 ENCOUNTER — Other Ambulatory Visit: Payer: Self-pay | Admitting: Internal Medicine

## 2020-07-13 DIAGNOSIS — Z1231 Encounter for screening mammogram for malignant neoplasm of breast: Secondary | ICD-10-CM

## 2020-07-30 ENCOUNTER — Ambulatory Visit: Payer: Self-pay

## 2020-09-06 ENCOUNTER — Ambulatory Visit
Admission: RE | Admit: 2020-09-06 | Discharge: 2020-09-06 | Disposition: A | Discharge status: Home / Self Care | Payer: Managed Care, Other (non HMO) | Source: Ambulatory Visit | Attending: Internal Medicine | Admitting: Internal Medicine

## 2020-09-06 ENCOUNTER — Other Ambulatory Visit: Payer: Self-pay

## 2020-09-06 DIAGNOSIS — Z1231 Encounter for screening mammogram for malignant neoplasm of breast: Secondary | ICD-10-CM

## 2020-10-05 ENCOUNTER — Other Ambulatory Visit: Payer: Self-pay | Admitting: Psychiatry

## 2020-10-05 DIAGNOSIS — F411 Generalized anxiety disorder: Secondary | ICD-10-CM

## 2020-10-05 NOTE — Telephone Encounter (Signed)
please schedule appt

## 2020-10-06 NOTE — Telephone Encounter (Signed)
LM to call back for an appointment.

## 2020-10-21 NOTE — Telephone Encounter (Signed)
noted 

## 2020-10-21 NOTE — Telephone Encounter (Signed)
Called again to make a F/U for patient but her mailbox is full now.

## 2020-10-25 ENCOUNTER — Other Ambulatory Visit: Payer: Self-pay | Admitting: Psychiatry

## 2020-10-25 DIAGNOSIS — F411 Generalized anxiety disorder: Secondary | ICD-10-CM

## 2020-10-25 MED ORDER — DULOXETINE HCL 40 MG PO CPEP: 1.0000 | ORAL_CAPSULE | Freq: Every day | ORAL | 0 refills | Status: DC

## 2020-10-25 NOTE — Telephone Encounter (Signed)
Next visit is 12/02/20

## 2020-10-25 NOTE — Addendum Note (Signed)
Addended by: Derenda Mis on: 10/25/2020 11:53 AM   Modules accepted: Orders

## 2020-10-25 NOTE — Telephone Encounter (Signed)
Resent

## 2020-12-01 IMAGING — MG DIGITAL SCREENING BILAT W/ TOMO W/ CAD
8 series · 8 of 24 positions shown · non-contrast
Comparison: Previous exam(s).

CLINICAL DATA: Screening.

EXAM:
DIGITAL SCREENING BILATERAL MAMMOGRAM WITH TOMO AND CAD

[L CC synth-2D]
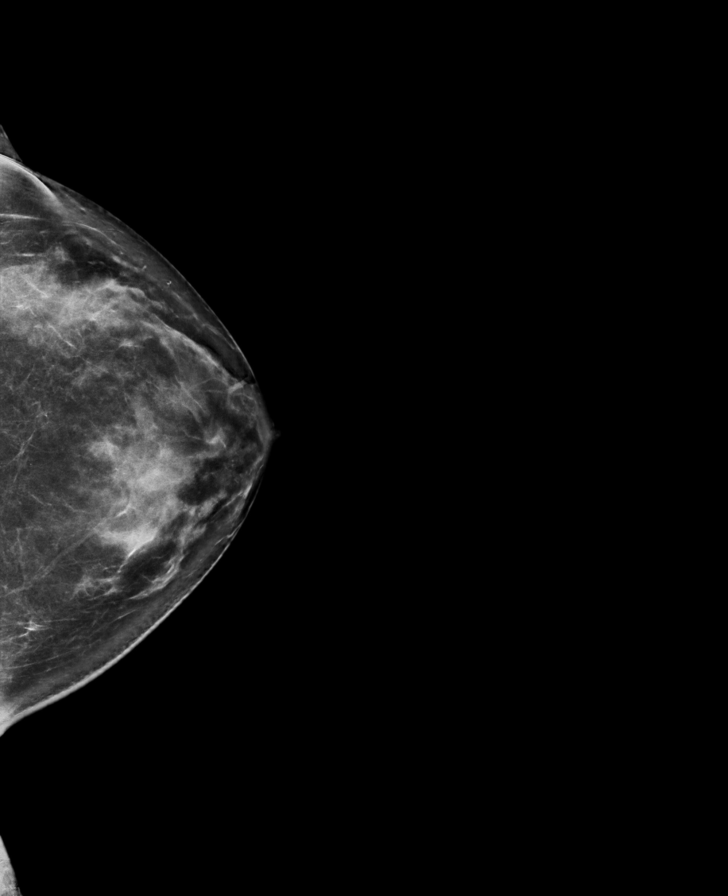

[R CC synth-2D]
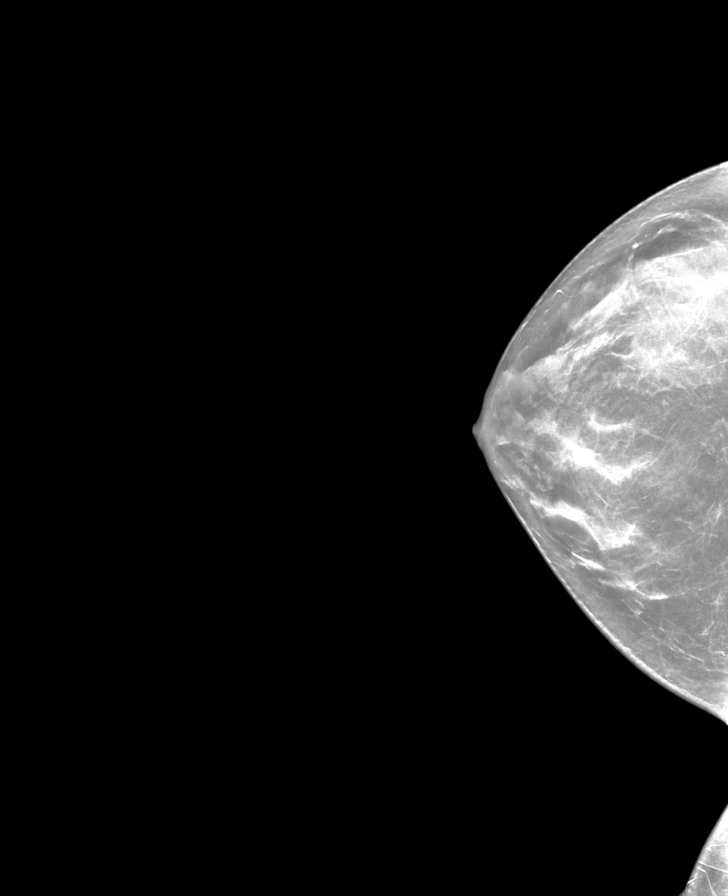

[L MLO synth-2D]
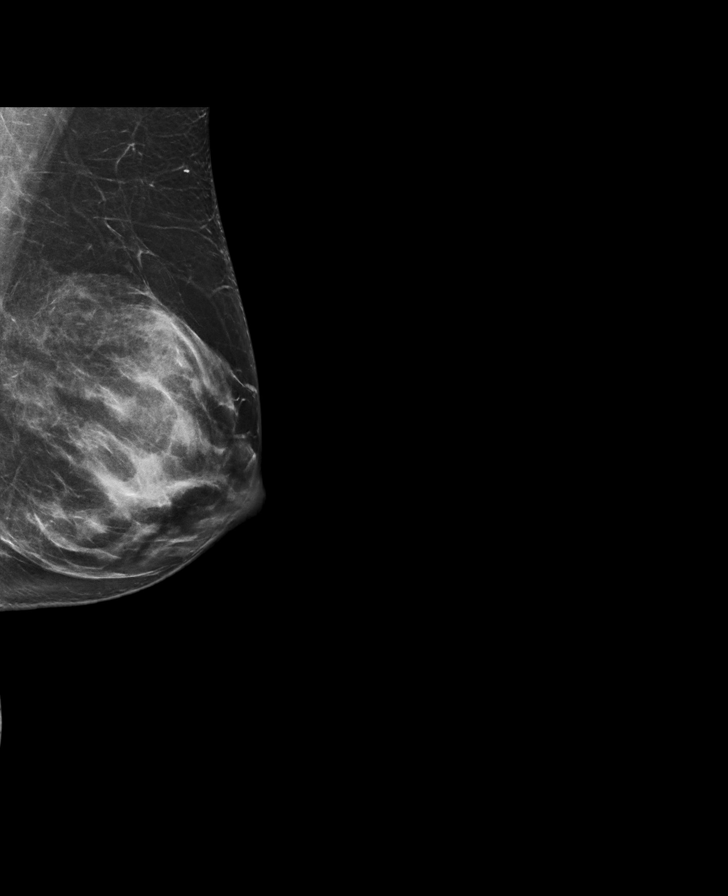

[R MLO synth-2D]
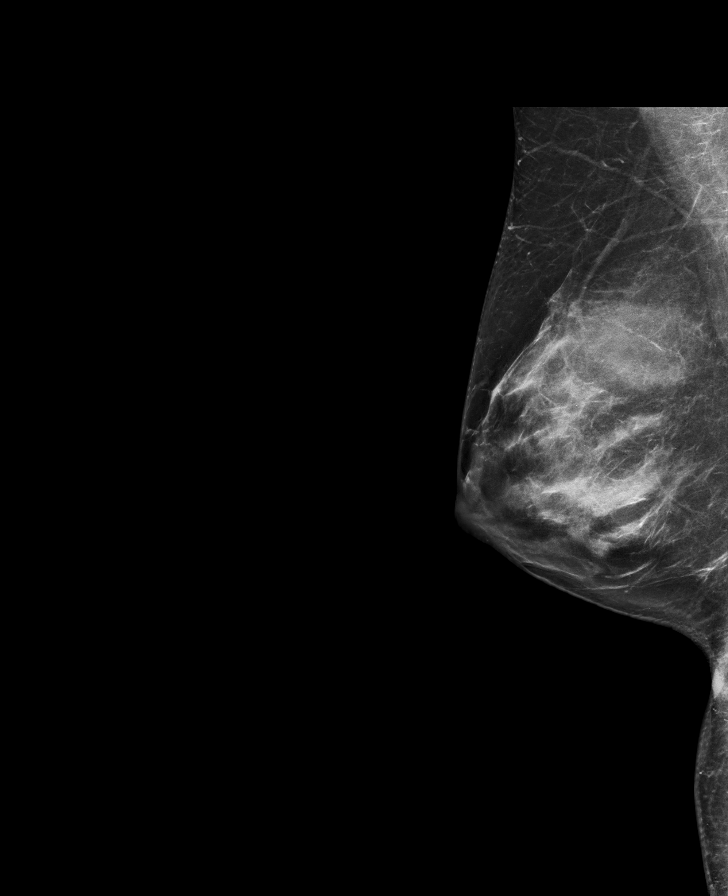

[R MLO tomo · tomo slice 41/80.0]
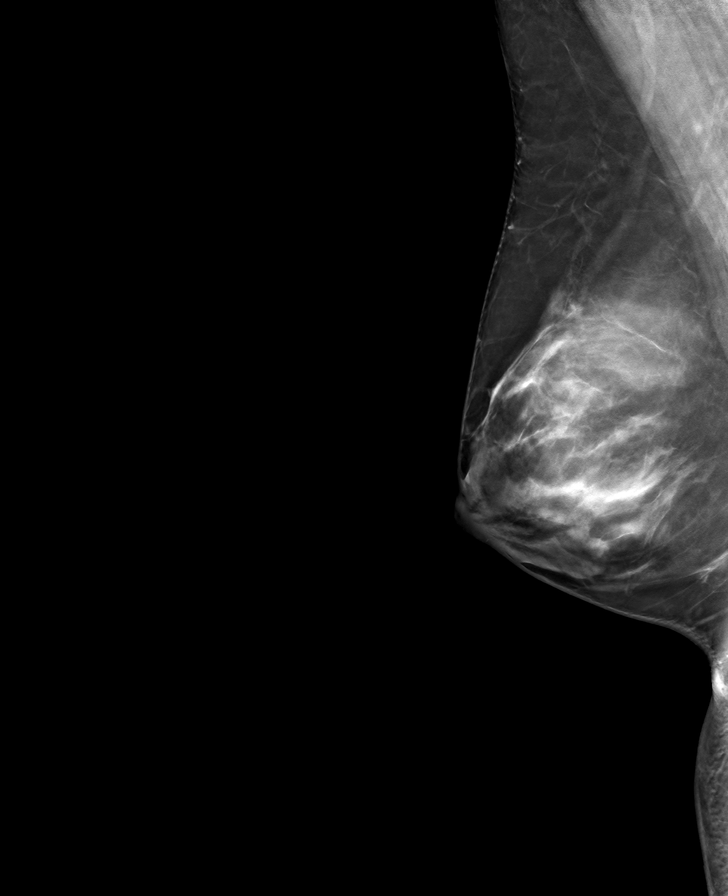

[L CC tomo · tomo slice 43/84.0]
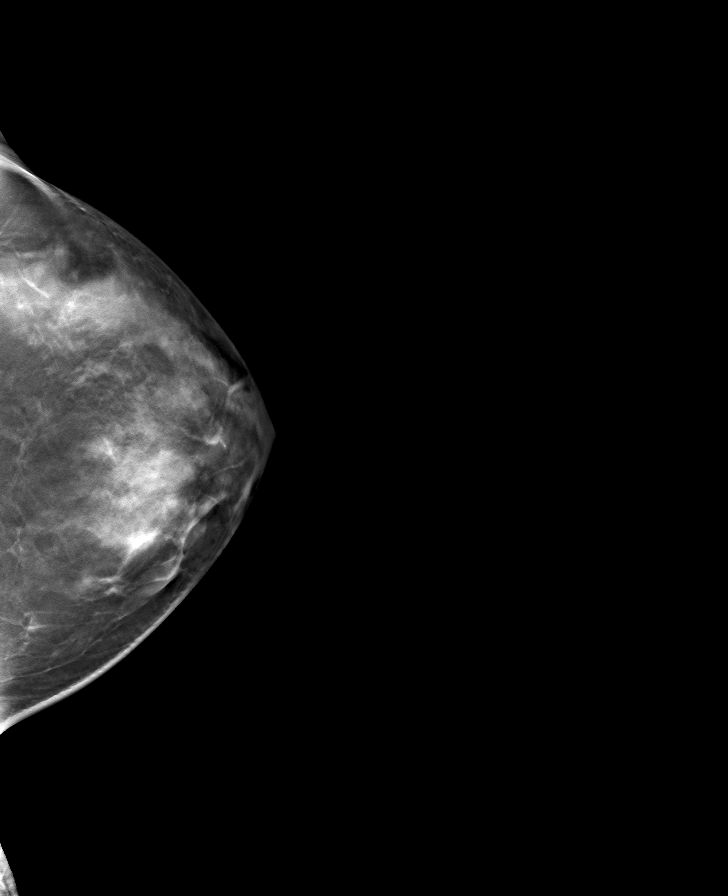

[L MLO tomo · tomo slice 41/80.0]
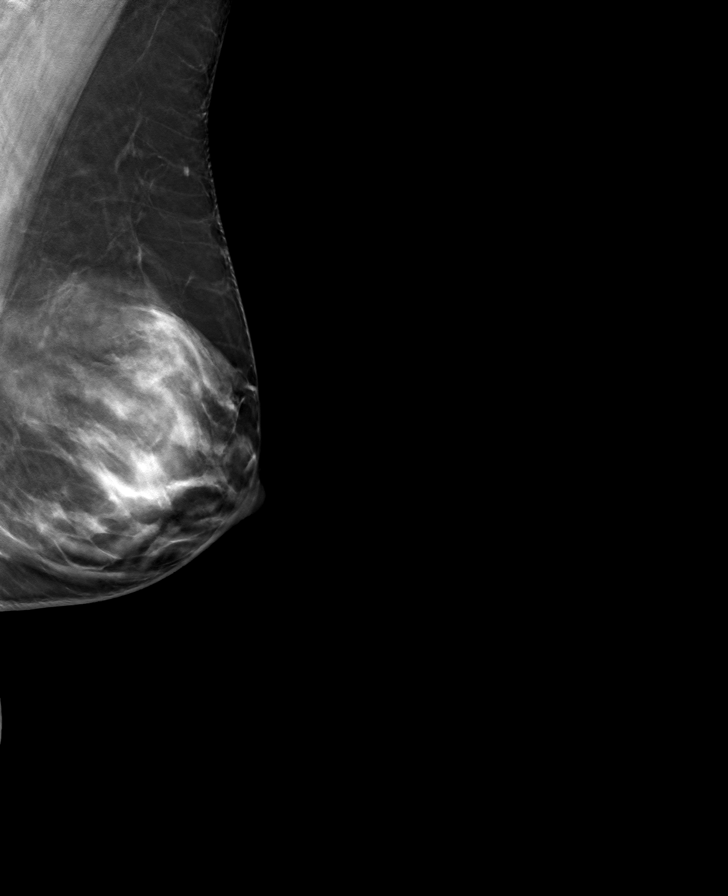

[R CC tomo · tomo slice 38/75.0]
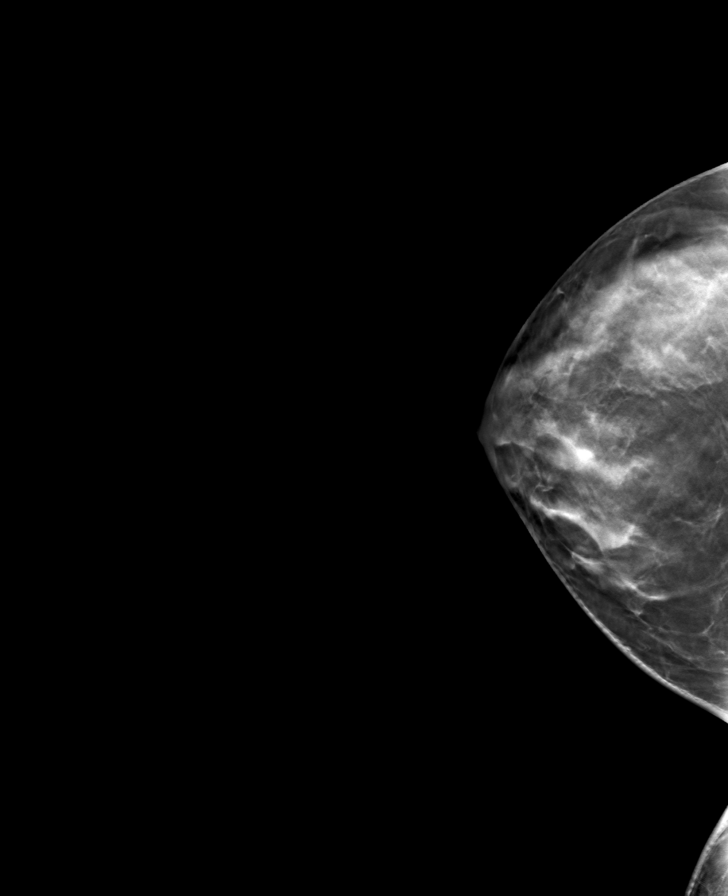

[8 of 24 positions shown; findings below may reference images not displayed]

ACR Breast Density Category c: The breast tissue is heterogeneously
dense, which may obscure small masses.
FINDINGS: There are no findings suspicious for malignancy. Images were
processed with CAD.
IMPRESSION: No mammographic evidence of malignancy. A result letter of this
screening mammogram will be mailed directly to the patient.

RECOMMENDATION:
Screening mammogram in one year. (Code:[id number]-[id number])

BI-RADS CATEGORY  1: Negative.

## 2020-12-02 ENCOUNTER — Ambulatory Visit: Payer: 59 | Admitting: Psychiatry

## 2020-12-23 ENCOUNTER — Telehealth: Payer: Self-pay | Admitting: Psychiatry

## 2020-12-23 DIAGNOSIS — F411 Generalized anxiety disorder: Secondary | ICD-10-CM

## 2020-12-23 MED ORDER — DULOXETINE HCL 60 MG PO CPEP: 60.0000 mg | ORAL_CAPSULE | Freq: Every day | ORAL | 1 refills | Status: DC

## 2020-12-23 NOTE — Telephone Encounter (Signed)
Please let her know it was sent.

## 2020-12-23 NOTE — Telephone Encounter (Signed)
Rtc to pt she stated she has increased anxiety and would like to go back to that dose. Admins please add her to cancellation list

## 2020-12-23 NOTE — Telephone Encounter (Signed)
Pt called and asked if she could go back up to 60 mg of cymbalta before her next visit on 6/29. [facility] [place] on [address]

## 2021-01-12 ENCOUNTER — Other Ambulatory Visit: Payer: Self-pay

## 2021-01-12 ENCOUNTER — Ambulatory Visit (INDEPENDENT_AMBULATORY_CARE_PROVIDER_SITE_OTHER): Payer: 59 | Admitting: Psychiatry

## 2021-01-12 ENCOUNTER — Encounter: Payer: Self-pay | Admitting: Psychiatry

## 2021-01-12 DIAGNOSIS — F411 Generalized anxiety disorder: Secondary | ICD-10-CM | POA: Diagnosis not present

## 2021-01-12 MED ORDER — DULOXETINE HCL 60 MG PO CPEP
60.0000 mg | ORAL_CAPSULE | Freq: Every day | ORAL | 1 refills | Status: DC
Start: 2021-01-12 — End: 2021-06-30

## 2021-01-12 NOTE — Progress Notes (Signed)
Amanda Gilmore [id number] 1975-08-22 45 y.o.  Subjective:   Patient ID:  Amanda Gilmore is a 45 y.o. (DOB 1976/05/16) female.  Chief Complaint:  Chief Complaint  Patient presents with   Anxiety     HPI Amanda Gilmore presents to the office today for follow-up of anxiety. She reports that increase in Cymbalta has helped her anxiety. She reports that she has some "peaks" in anxiety with rumination. She reports that anxiety has been manageable. Denies panic attacks. She reports that her mood has been "ok." Denies irritability. Slept well on vacation last week. She reports that prior to that she was periodically awakening at night. Sleep has been ok since returning from vacation. She reports that her energy and motivation have been low when working. Energy and motivation were better on vacation. Concentration has been "awful." She reports that she is very forgetful. Denies anhedonia. Appetite has been good. Appetite was decreased when anxiety was higher. Denies SI.   Reports that she was having difficulty remembering Buspar and did not see a significant improvement with this.   Work has been ok. Husband is a Runner, broadcasting/film/video and off in the summer. She enjoys being out in her yard.   Reports that she cannot remember much of her childhood.    Past Psychiatric Medication Trials: Latuda- Adverse reaction- tingling in hands and SOB Cymbalta- helped with anxiety. "Flat" feeling/affective dulling at 60 mg. Zoloft- heart palpitations Paxil- Cannot recall response Pristiq Viibryd Trintellix Wellbutrin - Prescribed but did not take Buspar Lamictal  Review of Systems:  Review of Systems  Cardiovascular:        She reports that last year she was having palpitations last year. Has not had palpitations recently.   Musculoskeletal:  Negative for gait problem.  Neurological:  Negative for tremors.  Psychiatric/Behavioral:         Please refer to HPI   Medications: I have reviewed the patient's current  medications.  Current Outpatient Medications  Medication Sig Dispense Refill   Cholecalciferol (VITAMIN D) 50 MCG (2000 UT) CAPS Take by mouth.     levothyroxine (SYNTHROID) 100 MCG tablet Take 100 mcg by mouth daily.     Multiple Vitamin (MULTIVITAMIN) tablet Take 1 tablet by mouth daily.     acetaminophen (TYLENOL) 500 MG tablet Take 1,000 mg by mouth every 6 (six) hours as needed for moderate pain or headache.     DULoxetine (CYMBALTA) 60 MG capsule Take 1 capsule (60 mg total) by mouth daily. 90 capsule 1   No current facility-administered medications for this visit.    Medication Side Effects: None  Allergies:  Allergies  Allergen Reactions   Latuda [Lurasidone] Shortness Of Breath    Tingling in hands     Past Medical History:  Diagnosis Date   Anxiety    Hypothyroid     Past Medical History, Surgical history, Social history, and Family history were reviewed and updated as appropriate.   Please see review of systems for further details on the patient's review from today.   Objective:   Physical Exam:  There were no vitals taken for this visit.  Physical Exam Constitutional:      General: She is not in acute distress. Musculoskeletal:        General: No deformity.  Neurological:     Mental Status: She is alert and oriented to person, place, and time.     Coordination: Coordination normal.  Psychiatric:        Attention and Perception:  Attention and perception normal. She does not perceive auditory or visual hallucinations.        Mood and Affect: Mood is not depressed. Affect is not labile, blunt, angry or inappropriate.        Speech: Speech normal.        Behavior: Behavior normal.        Thought Content: Thought content normal. Thought content is not paranoid or delusional. Thought content does not include homicidal or suicidal ideation. Thought content does not include homicidal or suicidal plan.        Cognition and Memory: Cognition and memory normal.         Judgment: Judgment normal.     Comments: Insight intact Mood presents as mildly anxious    Lab Review:     Component Value Date/Time   NA 137 05/02/2019 1631   K 4.6 05/02/2019 1631   CL 102 05/02/2019 1631   CO2 23 05/02/2019 1631   GLUCOSE 97 05/02/2019 1631   BUN 7 05/02/2019 1631   CREATININE 0.80 05/02/2019 1631   CALCIUM 9.3 05/02/2019 1631   PROT 7.5 05/02/2019 1631   ALBUMIN 4.4 05/02/2019 1631   AST 21 05/02/2019 1631   ALT 26 05/02/2019 1631   ALKPHOS 34 (L) 05/02/2019 1631   BILITOT 0.8 05/02/2019 1631   GFRNONAA >60 05/02/2019 1631   GFRAA >60 05/02/2019 1631       Component Value Date/Time   WBC 19.7 (H) 05/03/2019 0329   RBC 4.93 05/03/2019 0329   HGB 13.9 05/03/2019 0329   HCT 42.7 05/03/2019 0329   PLT 285 05/03/2019 0329   MCV 86.6 05/03/2019 0329   MCH 28.2 05/03/2019 0329   MCHC 32.6 05/03/2019 0329   RDW 12.8 05/03/2019 0329   LYMPHSABS 0.8 05/03/2019 0329   MONOABS 0.1 05/03/2019 0329   EOSABS 0.0 05/03/2019 0329   BASOSABS 0.0 05/03/2019 0329    No results found for: POCLITH, LITHIUM   No results found for: PHENYTOIN, PHENOBARB, VALPROATE, CBMZ   .res Assessment: Plan:   Pt reports that she prefers to continue Cymbalta at 60 mg dose for depression and anxiety. She reports that she would prefer to start therapy than make any changes to medication and requests therapy referral. Referral made to [facility]. Pt to follow-up in 6 months or sooner if clinically indicated.  Patient advised to contact office with any questions, adverse effects, or acute worsening in signs and symptoms.  Amanda Gilmore was seen today for anxiety.  Diagnoses and all orders for this visit:  Generalized anxiety disorder -     DULoxetine (CYMBALTA) 60 MG capsule; Take 1 capsule (60 mg total) by mouth daily.    Please see After Visit Summary for patient specific instructions.  Future Appointments  Date Time Provider Department Center  02/24/2021  9:00 AM  Stevphen Meuse, [facility] None  06/30/2021  8:30 AM Corie Chiquito, [facility] None    No orders of the defined types were placed in this encounter.   -------------------------------

## 2021-01-24 ENCOUNTER — Other Ambulatory Visit: Payer: Self-pay | Admitting: Psychiatry

## 2021-01-24 DIAGNOSIS — F411 Generalized anxiety disorder: Secondary | ICD-10-CM

## 2021-02-24 ENCOUNTER — Encounter: Payer: Self-pay | Admitting: Psychiatry

## 2021-02-24 ENCOUNTER — Other Ambulatory Visit: Payer: Self-pay

## 2021-02-24 ENCOUNTER — Ambulatory Visit (INDEPENDENT_AMBULATORY_CARE_PROVIDER_SITE_OTHER): Payer: 59 | Admitting: Psychiatry

## 2021-02-24 DIAGNOSIS — F411 Generalized anxiety disorder: Secondary | ICD-10-CM | POA: Diagnosis not present
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
## 2021-04-19 ENCOUNTER — Other Ambulatory Visit: Payer: Self-pay

## 2021-04-19 ENCOUNTER — Ambulatory Visit (INDEPENDENT_AMBULATORY_CARE_PROVIDER_SITE_OTHER): Payer: 59 | Admitting: Psychiatry

## 2021-04-19 DIAGNOSIS — F411 Generalized anxiety disorder: Secondary | ICD-10-CM | POA: Diagnosis not present

## 2021-04-19 NOTE — Progress Notes (Signed)
      [facility] Counselor/Therapist Progress Note  Patient ID: Amanda Gilmore, MRN: [id number],    Date: 04/19/2021  Time Spent: 43 minutes start time 8:07 AM end time 8:50 AM  Treatment Type: Individual Therapy  Reported Symptoms: anxiety, panic, rumination, fatigue, focusing issues, sadness  Mental Status Exam:  Appearance:   Well Groomed     Behavior:  Appropriate  Motor:  Normal  Speech/Language:   Normal Rate  Affect:  Appropriate  Mood:  anxious  Thought process:  normal  Thought content:    WNL  Sensory/Perceptual disturbances:    WNL  Orientation:  oriented to person, place, time/date, and situation  Attention:  Good  Concentration:  Good  Memory:  WNL  Fund of knowledge:   Good  Insight:    Good  Judgment:   Good  Impulse Control:  Good   Risk Assessment: Danger to Self:  No Self-injurious Behavior: No Danger to Others: No Duty to Warn:no Physical Aggression / Violence:No  Access to Firearms a concern: No  Gang Involvement:No   Subjective: Patient was present for session.  She shared that she has realized triggers for the ruminating thoughts.  Patient shared that she got triggered even coming into the session this morning.  Patient processed fear of phone being on suds level 10, negative cognition "I am going to hurt someone" felt anxiety in her chest and stomach.  Patient responded well to processing and was able to reduce suds level 2-3.  Discussed importance of allowing processing to continue.  Discussed brain spotting bilateral music to listen to.  Also taught patient grounding technique to help if panic surfaces.  Patient was also encouraged to take regular breaks during the day and walk for 5 minutes when she feels she is not effective at her work.  Interventions: Solution-Oriented/Positive Psychology, Eye Movement Desensitization and Reprocessing (EMDR), and Insight-Oriented  Diagnosis:   ICD-10-CM   1. Generalized anxiety disorder  F41.1        Plan: Patient is to use coping skills to decrease anxiety symptoms.  Patient is to work on movement throughout the day especially when she feels unfocused.  Patient is to continue working on appropriate self talk when anxiety surfaces.  Patient is to take medication as directed Long-term goal: Enhance ability to handle effectively the full variety of life's anxieties-decrease ruminating thoughts by 50% Short-term goal: Identify an anxiety coping mechanism that has been successful in the past and increase its use: Increase understanding of beliefs and messages that produce the worry and anxiety  Stevphen Meuse, [facility]

## 2021-05-04 ENCOUNTER — Ambulatory Visit (INDEPENDENT_AMBULATORY_CARE_PROVIDER_SITE_OTHER): Payer: 59 | Admitting: Psychiatry

## 2021-05-04 ENCOUNTER — Other Ambulatory Visit: Payer: Self-pay

## 2021-05-04 DIAGNOSIS — F411 Generalized anxiety disorder: Secondary | ICD-10-CM | POA: Diagnosis not present

## 2021-05-04 NOTE — Progress Notes (Signed)
      [facility] Counselor/Therapist Progress Note  Patient ID: Amanda Gilmore, MRN: [id number],    Date: 05/04/2021  Time Spent: 58 minutes start time 9:05 AM end time 10:03 a.m.  Treatment Type: Individual Therapy  Reported Symptoms: anxiety, obsessive thinking, rumination, sleep issues, sadness  Mental Status Exam:  Appearance:   Well Groomed     Behavior:  Appropriate  Motor:  Normal  Speech/Language:   Normal Rate  Affect:  Appropriate  Mood:  anxious  Thought process:  normal  Thought content:    WNL  Sensory/Perceptual disturbances:    WNL  Orientation:  oriented to person, place, time/date, and situation  Attention:  Good  Concentration:  Good  Memory:  WNL  Fund of knowledge:   Good  Insight:    Good  Judgment:   Good  Impulse Control:  Good   Risk Assessment: Danger to Self:  No Self-injurious Behavior: No Danger to Others: No Duty to Warn:no Physical Aggression / Violence:No  Access to Firearms a concern: No  Gang Involvement:No   Subjective: Patient was present for session.  She shared that she went to a fall festival and that was the first time she had been in a crowd.  She got triggered and was concerned about COVID that left lots of worry.  Did EMDR set on COVID and sons face being painted, suds level 10, negative cognition "I have to protect everyone" felt anxiety in her legs and core.  Patient was able to reduce suds level to 5.  She was able to recognize that part of the issue for her as she feels she has so much to do and she really cannot accomplish everything.  Patient was able to share she probably needs to talk through some different situations with her husband.  Patient was encouraged to focus on self-care and releasing her emotions from her body regularly.  Interventions: Solution-Oriented/Positive Psychology, Eye Movement Desensitization and Reprocessing (EMDR), and Insight-Oriented  Diagnosis:   ICD-10-CM   1. Generalized anxiety disorder   F41.1       Plan: Patient is to use coping skills to decrease anxiety symptoms.  Patient is to work on releasing anxiety from her body regularly through movement/exercise.  Patient is to talk with her husband about ways that they can work together on some of these issues.  Patient is to take medication as directed. Long-term goal: Enhance ability to handle effectively the full variety of life's anxieties-decrease ruminating thoughts by 50% Short-term goal: Identify an anxiety coping mechanism that has been successful in the past and increase its use: Increase understanding of beliefs and messages that produce the worry and anxiety  Stevphen Meuse, [facility]

## 2021-05-18 ENCOUNTER — Ambulatory Visit: Payer: 59 | Admitting: Psychiatry

## 2021-06-01 ENCOUNTER — Other Ambulatory Visit: Payer: Self-pay

## 2021-06-01 ENCOUNTER — Ambulatory Visit (INDEPENDENT_AMBULATORY_CARE_PROVIDER_SITE_OTHER): Payer: 59 | Admitting: Psychiatry

## 2021-06-01 ENCOUNTER — Ambulatory Visit: Payer: 59 | Admitting: Psychiatry

## 2021-06-01 DIAGNOSIS — F411 Generalized anxiety disorder: Secondary | ICD-10-CM | POA: Diagnosis not present

## 2021-06-01 NOTE — Progress Notes (Signed)
      [facility] Counselor/Therapist Progress Note  Patient ID: MARGRETE DELUDE, MRN: [id number],    Date: 06/01/2021  Time Spent: 50 minutes start time 8:06 AM end 8:56 AM  Treatment Type: Individual Therapy  Reported Symptoms: anxiety,rumination, sleep issues  Mental Status Exam:  Appearance:   Casual     Behavior:  Appropriate  Motor:  Normal  Speech/Language:   Normal Rate  Affect:  Appropriate  Mood:  anxious  Thought process:  normal  Thought content:    WNL  Sensory/Perceptual disturbances:    WNL  Orientation:  oriented to person, place, time/date, and situation  Attention:  Good  Concentration:  Good  Memory:  WNL  Fund of knowledge:   Good  Insight:    Good  Judgment:   Good  Impulse Control:  Good   Risk Assessment: Danger to Self:  No Self-injurious Behavior: No Danger to Others: No Duty to Warn:no Physical Aggression / Violence:No  Access to Firearms a concern: No  Gang Involvement:No   Subjective: Patient was present for session.  She shared that she will be traveling with work soon and she is anxious about COVID.  She is also shared that she is continuing to work on things with her husband and they are still very difficult.  Discussed different strategies to help her manage her thoughts more appropriately.  Encouraged patient to remind herself of the facts and taste focused on the truce.  Patient is to write out all the ways that she is taking care of herself and making positive decisions and to read them as she needs to.  Patient reported feeling positive about this situation at the end of session.  Interventions: Cognitive Behavioral Therapy and Solution-Oriented/Positive Psychology  Diagnosis:   ICD-10-CM   1. Generalized anxiety disorder  F41.1       Plan: Patient is to use CBT and coping skills to decrease anxiety symptoms.  Is to follow plans from session to manage anxiety with her trip.  Patient is to take medication as directed. Long-term  goal: Enhance ability to handle effectively the full variety of life's anxieties-decrease ruminating thoughts by 50% Short-term goal: Identify an anxiety coping mechanism that has been successful in the past and increase its use: Increase understanding of beliefs and messages that produce the worry and anxiety  Stevphen Meuse, [facility]

## 2021-06-02 ENCOUNTER — Ambulatory Visit: Payer: 59 | Admitting: Psychiatry

## 2021-06-30 ENCOUNTER — Encounter: Payer: Self-pay | Admitting: Psychiatry

## 2021-06-30 ENCOUNTER — Ambulatory Visit (INDEPENDENT_AMBULATORY_CARE_PROVIDER_SITE_OTHER): Payer: 59 | Admitting: Psychiatry

## 2021-06-30 DIAGNOSIS — F411 Generalized anxiety disorder: Secondary | ICD-10-CM

## 2021-06-30 MED ORDER — DULOXETINE HCL 60 MG PO CPEP: 60.0000 mg | ORAL_CAPSULE | Freq: Every day | ORAL | 1 refills | Status: DC

## 2021-06-30 NOTE — Progress Notes (Signed)
Amanda Gilmore [id number] [birth year] 45 y.o.  Virtual Visit via Telephone Note  I connected with pt on 06/30/21 at  8:30 AM EST by telephone and verified that I am speaking with the correct person using two identifiers.   I discussed the limitations, risks, security and privacy concerns of performing an evaluation and management service by telephone and the availability of in person appointments. I also discussed with the patient that there may be a patient responsible charge related to this service. The patient expressed understanding and agreed to proceed.   I discussed the assessment and treatment plan with the patient. The patient was provided an opportunity to ask questions and all were answered. The patient agreed with the plan and demonstrated an understanding of the instructions.   The patient was advised to call back or seek an in-person evaluation if the symptoms worsen or if the condition fails to improve as anticipated.  I provided 25 minutes of non-face-to-face time during this encounter.  The patient was located at home.  The provider was located at [facility].   Corie Chiquito, PMHNP   Subjective:   Patient ID:  Amanda Gilmore is a 45 y.o. (DOB 1975-12-19) female.  Chief Complaint:  Chief Complaint  Patient presents with   Anxiety    HPI Amanda Gilmore presents for follow-up of anxiety. She reports, "it's been going ok." Has started seeing Stevphen Meuse, [facility] for therapy.   Traveled to [place] for 6 days for work and this triggered some anxiety with germs and has noticed increased handwashing and not wanting to touch certain things. Reports that she had some long flights, deal with crowds, and concerns about getting sick. Has also had heightened generalized anxiety and rumination. Notices increased HR up to 120. HR is typically in the 90's. She reports that sometimes her head will shake and her ears will "roar." Denies panic. She reports that she has had some  "significant crying sessions." Denies depressed mood or irritability. Had difficulty falling asleep and winding down while traveling. Estimates sleeping 6-7 hours a night. Appetite has been "ok" and has not been as hungry. Has had some anxiety about germs with food that she has ordered and some anxiety about eating at home. Reports fear of germs has prevented her from eating when hungry. Prefers pre-packaged foods and has felt the need to wash package. Was worried about eating food in [place] due to thoughts of "are my hands clean enough?" She reports that she continues to have difficulty with concentration and reports that this may be related to rumination. Describes energy and motivation as, "so, so. I do it because I have to." Denies SI.   She reports that occasionally anxiety "gets triggered."   Will be traveling over the weekend with family. She will be flying.    Past Psychiatric Medication Trials: Latuda- Adverse reaction- tingling in hands and SOB Cymbalta- helped with anxiety. "Flat" feeling/affective dulling at 60 mg. Zoloft- heart palpitations Paxil- Cannot recall response Pristiq Viibryd Trintellix Wellbutrin - Prescribed but did not take Buspar Lamictal  Review of Systems:  Review of Systems  Cardiovascular:  Negative for palpitations.  Musculoskeletal:  Negative for gait problem.  Neurological:  Negative for tremors.  Psychiatric/Behavioral:         Please refer to HPI   Medications: I have reviewed the patient's current medications.  Current Outpatient Medications  Medication Sig Dispense Refill   Cholecalciferol (VITAMIN D) 50 MCG (2000 UT) CAPS Take by mouth.  levothyroxine (SYNTHROID) 100 MCG tablet Take 100 mcg by mouth daily.     Multiple Vitamin (MULTIVITAMIN) tablet Take 1 tablet by mouth daily.     Multiple Vitamins-Minerals (ZINC PO) Take by mouth.     acetaminophen (TYLENOL) 500 MG tablet Take 1,000 mg by mouth every 6 (six) hours as needed for moderate  pain or headache.     DULoxetine (CYMBALTA) 60 MG capsule Take 1 capsule (60 mg total) by mouth daily. 90 capsule 1   No current facility-administered medications for this visit.    Medication Side Effects: None  Allergies:  Allergies  Allergen Reactions   Latuda [Lurasidone] Shortness Of Breath    Tingling in hands     Past Medical History:  Diagnosis Date   Anxiety    Hypothyroid     Family History  Problem Relation Age of Onset   Hypertension Mother    CAD Father 43       11   Alzheimer's disease Father     Social History   Socioeconomic History   Marital status: Married    Spouse name: Not on file   Number of children: 1   Years of education: Not on file   Highest education level: Not on file  Occupational History   Not on file  Tobacco Use   Smoking status: Never   Smokeless tobacco: Never  Substance and Sexual Activity   Alcohol use: Yes    Alcohol/week: 2.0 standard drinks    Types: 2 Glasses of wine per week   Drug use: Not on file   Sexual activity: Not on file  Other Topics Concern   Not on file  Social History Narrative   Lives at home with husband and son.   Social Determinants of Health   Financial Resource Strain: Not on file  Food Insecurity: Not on file  Transportation Needs: Not on file  Physical Activity: Not on file  Stress: Not on file  Social Connections: Not on file  Intimate Partner Violence: Not on file    Past Medical History, Surgical history, Social history, and Family history were reviewed and updated as appropriate.   Please see review of systems for further details on the patient's review from today.   Objective:   Physical Exam:  There were no vitals taken for this visit.  Physical Exam Neurological:     Mental Status: She is alert and oriented to person, place, and time.     Cranial Nerves: No dysarthria.  Psychiatric:        Attention and Perception: Attention and perception normal.        Mood and  Affect: Mood is anxious.        Speech: Speech normal.        Behavior: Behavior is cooperative.        Thought Content: Thought content normal. Thought content is not paranoid or delusional. Thought content does not include homicidal or suicidal ideation. Thought content does not include homicidal or suicidal plan.        Cognition and Memory: Cognition and memory normal.        Judgment: Judgment normal.     Comments: Insight intact    Lab Review:     Component Value Date/Time   NA 137 05/02/2019 1631   K 4.6 05/02/2019 1631   CL 102 05/02/2019 1631   CO2 23 05/02/2019 1631   GLUCOSE 97 05/02/2019 1631   BUN 7 05/02/2019 1631   CREATININE 0.80 05/02/2019  1631   CALCIUM 9.3 05/02/2019 1631   PROT 7.5 05/02/2019 1631   ALBUMIN 4.4 05/02/2019 1631   AST 21 05/02/2019 1631   ALT 26 05/02/2019 1631   ALKPHOS 34 (L) 05/02/2019 1631   BILITOT 0.8 05/02/2019 1631   GFRNONAA >60 05/02/2019 1631   GFRAA >60 05/02/2019 1631       Component Value Date/Time   WBC 19.7 (H) 05/03/2019 0329   RBC 4.93 05/03/2019 0329   HGB 13.9 05/03/2019 0329   HCT 42.7 05/03/2019 0329   PLT 285 05/03/2019 0329   MCV 86.6 05/03/2019 0329   MCH 28.2 05/03/2019 0329   MCHC 32.6 05/03/2019 0329   RDW 12.8 05/03/2019 0329   LYMPHSABS 0.8 05/03/2019 0329   MONOABS 0.1 05/03/2019 0329   EOSABS 0.0 05/03/2019 0329   BASOSABS 0.0 05/03/2019 0329    No results found for: POCLITH, LITHIUM   No results found for: PHENYTOIN, PHENOBARB, VALPROATE, CBMZ   .res Assessment: Plan:    Patient seen for 25 minutes and time spent discussing recent exacerbation of anxiety. Discussed potential benefits, risks, and side effects of Propranolol. Discussed that Propranolol can be used as needed for anxiety signs and symptoms.  She reports that she will consider this and contact office if she would like to initiate propranolol.  She reports that she would prefer not to make any medication changes at this time.  She  reports that she would like to continue to work through anxiety triggers and stressors on her own and in therapy. Will continue Cymbalta 60 mg daily for anxiety. Recommend continuing therapy with Stevphen Meuse, [facility].  Pt to follow-up in 6 months or sooner if clinically indicated.  Patient advised to contact office with any questions, adverse effects, or acute worsening in signs and symptoms.    Amanda Gilmore was seen today for anxiety.  Diagnoses and all orders for this visit:  Generalized anxiety disorder -     DULoxetine (CYMBALTA) 60 MG capsule; Take 1 capsule (60 mg total) by mouth daily.    Please see After Visit Summary for patient specific instructions.  Future Appointments  Date Time Provider Department Center  07/04/2021  2:00 PM Stevphen Meuse, [facility] None  07/20/2021  9:00 AM Stevphen Meuse, [facility] None  08/03/2021  9:00 AM Stevphen Meuse, [facility] None    No orders of the defined types were placed in this encounter.     -------------------------------

## 2021-07-04 ENCOUNTER — Ambulatory Visit (INDEPENDENT_AMBULATORY_CARE_PROVIDER_SITE_OTHER): Payer: 59 | Admitting: Psychiatry

## 2021-07-04 ENCOUNTER — Telehealth: Payer: Self-pay | Admitting: Psychiatry

## 2021-07-04 DIAGNOSIS — F411 Generalized anxiety disorder: Secondary | ICD-10-CM

## 2021-07-04 NOTE — Telephone Encounter (Signed)
[name], kovacic are scheduled for a virtual visit with your provider today.    Just as we do with appointments in the office, we must obtain your consent to participate.  Your consent will be active for this visit and any virtual visit you may have with one of our providers in the next 365 days.    If you have a MyChart account, I can also send a copy of this consent to you electronically.  All virtual visits are billed to your insurance company just like a traditional visit in the office.  As this is a virtual visit, video technology does not allow for your provider to perform a traditional examination.  This may limit your provider's ability to fully assess your condition.  If your provider identifies any concerns that need to be evaluated in person or the need to arrange testing such as labs, EKG, etc, we will make arrangements to do so.    Although advances in technology are sophisticated, we cannot ensure that it will always work on either your end or our end.  If the connection with a video visit is poor, we may have to switch to a telephone visit.  With either a video or telephone visit, we are not always able to ensure that we have a secure connection.   I need to obtain your verbal consent now.   Are you willing to proceed with your visit today?   Amanda Gilmore has provided verbal consent on 07/04/2021 for a virtual visit (video or telephone).   Stevphen Meuse, [facility] 07/04/2021  2:10 PM

## 2021-07-04 NOTE — Progress Notes (Signed)
[facility] Counselor/Therapist Progress Note  Patient ID: Amanda Gilmore, MRN: [id number],    Date: 07/04/2021  Time Spent: 23 minutes start time 2:05 PM end time 2:28 PM Virtual Visit via Telehealth Note Connected with patient by a telemedicine/telehealth application, with their informed consent, and verified patient privacy and that I am speaking with the correct person using two identifiers. I discussed the limitations, risks, security and privacy concerns of performing psychotherapy and the availability of in person appointments. I also discussed with the patient that there may be a patient responsible charge related to this service. The patient expressed understanding and agreed to proceed. I discussed the treatment planning with the patient. The patient was provided an opportunity to ask questions and all were answered. The patient agreed with the plan and demonstrated an understanding of the instructions. The patient was advised to call  our office if  symptoms worsen or feel they are in a crisis state and need immediate contact.   Therapist Location: office Patient Location: home    Treatment Type: Individual Therapy  Reported Symptoms: anxiety, obsessive thinking, rumination, compulsive behaviors  Mental Status Exam:  Appearance:   NA     Behavior:  na  Motor:  na  Speech/Language:   Normal Rate  Affect:  NA  Mood:  anxious  Thought process:  normal  Thought content:    WNL  Sensory/Perceptual disturbances:    WNL  Orientation:  oriented to person, place, time/date, and situation  Attention:  Good  Concentration:  Good  Memory:  WNL  Fund of knowledge:   Good  Insight:    Good  Judgment:   Good  Impulse Control:  Good   Risk Assessment: Danger to Self:  No Self-injurious Behavior: No Danger to Others: No Duty to Warn:no Physical Aggression / Violence:No  Access to Firearms a concern: No  Gang Involvement:No   Subjective: Met with patient via phone  session.  She shared she was sick and had not worked on her self-care for the day so she did not want to do a video session.  She also reported not feeling as comfortable doing a long session over the phone so agreed to keep it to a half session.  Utilized time in session to discuss different coping skills and ways to alter her self talk to decrease anxiety and the obsessive thinking.  Patient was encouraged to focus on the things that she can control fix and change.  Was given different ideas of videos to watch on line to help utilize different coping skills that have been discussed in sessions previously.  Patient was also reminded of the importance of self-care including diet and exercise and regular affirmations.  Interventions: Cognitive Behavioral Therapy and Solution-Oriented/Positive Psychology  Diagnosis:   ICD-10-CM   1. Generalized anxiety disorder  F41.1       Plan: Patient is to use CBT and coping skills to decrease anxiety symptoms.  Patient is to work on exercising to release negative emotions appropriately.  Patient is to work on Air traffic controller and leaving herself different affirmations in place where she will see them so she can repeat them.  Patient is to watch videos on grounding exercises that have been discussed in session on line.  Patient is to take medication as directed. Long-term goal: Enhance ability to handle effectively the full variety of life's anxieties-decrease ruminating thoughts by 50% Short-term goal: Identify an anxiety coping mechanism that has been successful in the past and  increase its use: Increase understanding of beliefs and messages that produce the worry and anxiety  Lina Sayre, [facility]

## 2021-07-14 ENCOUNTER — Ambulatory Visit (INDEPENDENT_AMBULATORY_CARE_PROVIDER_SITE_OTHER): Payer: 59 | Admitting: Psychiatry

## 2021-07-14 ENCOUNTER — Other Ambulatory Visit: Payer: Self-pay

## 2021-07-14 DIAGNOSIS — F429 Obsessive-compulsive disorder, unspecified: Secondary | ICD-10-CM
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
## 2021-07-20 ENCOUNTER — Encounter: Payer: Self-pay | Admitting: Psychiatry

## 2021-07-20 ENCOUNTER — Other Ambulatory Visit: Payer: Self-pay

## 2021-07-20 ENCOUNTER — Ambulatory Visit (INDEPENDENT_AMBULATORY_CARE_PROVIDER_SITE_OTHER): Payer: 59 | Admitting: Psychiatry

## 2021-07-20 DIAGNOSIS — F429 Obsessive-compulsive disorder, unspecified: Secondary | ICD-10-CM

## 2021-07-20 DIAGNOSIS — F411 Generalized anxiety disorder: Secondary | ICD-10-CM

## 2021-07-20 MED ORDER — PAROXETINE HCL 20 MG PO TABS: ORAL_TABLET | ORAL | 0 refills | Status: DC

## 2021-07-20 MED ORDER — DULOXETINE HCL 30 MG PO CPEP: 30.0000 mg | ORAL_CAPSULE | Freq: Every day | ORAL | 0 refills | Status: DC

## 2021-07-20 MED ORDER — ALPRAZOLAM 0.25 MG PO TABS: ORAL_TABLET | ORAL | 1 refills | Status: DC

## 2021-07-20 MED ORDER — PAROXETINE HCL 30 MG PO TABS: 30.0000 mg | ORAL_TABLET | Freq: Every day | ORAL | 1 refills | Status: DC

## 2021-07-20 NOTE — Progress Notes (Signed)
°      [facility] Counselor/Therapist Progress Note  Patient ID: Amanda Gilmore, MRN: [id number],    Date: 07/20/2021  Time Spent: 52 minutes start time 9:07 AM end time 9:59 AM  Treatment Type: Individual Therapy  Reported Symptoms: obsessive thinking, anxiety, fear of germs, panic, crying spells, sadness, rumination  Mental Status Exam:  Appearance:   Casual     Behavior:  Appropriate  Motor:  Normal  Speech/Language:   Normal Rate  Affect:  Appropriate and Tearful  Mood:  anxious and depressed  Thought process:  circumstantial  Thought content:    Rumination  Sensory/Perceptual disturbances:    WNL  Orientation:  oriented to person, place, time/date, and situation  Attention:  Good  Concentration:  Good  Memory:  WNL  Fund of knowledge:   Fair  Insight:    Good  Judgment:   Good  Impulse Control:  Good   Risk Assessment: Danger to Self:  No Self-injurious Behavior: No Danger to Others: No Duty to Warn:no Physical Aggression / Violence:No  Access to Firearms a concern: No  Gang Involvement:No   Subjective: Patient was present for session.  She shared that she is continuing to have lots of struggles and be very upset.  She is not sure what to do with her work situation.  She was able to get her boss to let her work from home for this week.  She shared that her husband has asked her to meet with her primary care physician to get a blood panel completed in case there is something medically creating some of the increase in her OCD symptoms like hormones or thyroid issues.  Patient will be seeing her provider Amanda Gilmore PMHNP in the afternoon to discuss different options for medications.  Patient stated she wanted to work on her daily functioning in session.  Did EMDR set on brushing her teeth, suds level 10, negative cognition "I am not clean enough" felt anxiety in her chest.  Patient was able to reduce suds level to 6.  A couple visualizations were developed during the  processing which patient felt calming.  Patient was encouraged to use those visualizations as well as do what she has to to use her tools to be able to do basic self hygiene.  Interventions: Cognitive Behavioral Therapy, Solution-Oriented/Positive Psychology, Eye Movement Desensitization and Reprocessing (EMDR), and Insight-Oriented  Diagnosis:   ICD-10-CM   1. Obsessive-compulsive disorder, unspecified type  F42.9       Plan: Patient is to use CBT and coping skills to decrease anxiety symptoms.  Patient is to use visualizations from session to help decrease anxiety.  Patient is to use tools such as gloves and wipes as needed to help her be able to do basic hygiene.  Patient is to continue working with her provider Amanda Gilmore, PMH, NP on medication. Long-term goal: Enhance ability to handle effectively the full variety of life's anxieties-decrease ruminating thoughts by 50% Short-term goal: Identify an anxiety coping mechanism that has been successful in the past and increase its use: Increase understanding of beliefs and messages that produce the worry and anxiety    Stevphen Meuse, [facility]

## 2021-07-20 NOTE — Patient Instructions (Addendum)
Decrease Cymbalta to 30 mg daily for one week, then stop.   Start Paxil 20 mg daily for one week (while taking Cymbalta 30 mg), then increase Paxil to 30 mg daily.  Recommend taking Alprazolam three times daily if possible over the next several days to stabilize acute anxiety, then can reduce and take more on an as needed basis.

## 2021-07-20 NOTE — Progress Notes (Signed)
Amanda Gilmore [id number] Jul 03, 1976 46 y.o.  Subjective:   Patient ID:  Amanda Gilmore is a 46 y.o. (DOB Apr 02, 1976) female.  Chief Complaint:  Chief Complaint  Patient presents with   Anxiety    HPI BRIDGIT EYNON presents to the office today for follow-up of anxiety and OCD. She reports "my anxiety is through the roof and my OCD is though the roof." She reports that anxiety kept her from celebrating Christmas with family- "I just couldn't go over there." She reports fear of germs and contamination. She reports intrusive thoughts. Excessive handwashing. Isolates items when they first come into their home. Will not touch items that someone who has not washed their hands has touched. Asking family to repeatedly wash hands. Wiping things down. She reports that compulsions have "been exhausting" and adding considerable time to daily tasks. She reports some generalized anxiety and worry. She is experiencing chest tightness with anxiety and occasional tingling "all over my body." She reports that she has had severe crying episodes. Rumination.   Reports that she may be experiencing "a little bit" of depression. Some sadness in response to dealing with anxiety. She reports sleep is "ok, wake up tired." Low energy and low motivation. She reports poor concentration. Distracted by anxious thoughts. Reports that she has difficulty processing information and husband has commented that at times she will "just stand there." Denies SI.   Lost almost 15 lbs. Has not been eating much. Concerns about food being contaminated. Has not been taking vitamins due to concerns about contamination.   Concerns about people listening to their conversations, ie did she turn off work [employer].  "I knew I couldn't go into the office" and told her boss that she is dealing with a medical issue. Reports that her boss approved her to work from home through next week.   Sees PCP tomorrow.  Mother takes Paxil.  Past  Psychiatric Medication Trials: Latuda- Adverse reaction- tingling in hands and SOB Cymbalta- helped with anxiety. "Flat" feeling/affective dulling at 60 mg. Zoloft- heart palpitations Paxil-? Pristiq Viibryd- Side effects. Stopped after 2 weeks.  Trintellix Wellbutrin - Prescribed but did not take Buspar- took briefly Lamictal Xanax  Review of Systems:  Review of Systems  Gastrointestinal: Negative.   Musculoskeletal:  Negative for gait problem.  Neurological:  Negative for tremors.  Psychiatric/Behavioral:         Please refer to HPI   Medications: I have reviewed the patient's current medications.  Current Outpatient Medications  Medication Sig Dispense Refill   levothyroxine (SYNTHROID) 100 MCG tablet Take 100 mcg by mouth daily.     PARoxetine (PAXIL) 20 MG tablet Take 1 tablet at bedtime for one week, then start 30 mg tabs 7 tablet 0   PARoxetine (PAXIL) 30 MG tablet Take 1 tablet (30 mg total) by mouth daily. Start after taking 20 mg daily for one week 30 tablet 1   acetaminophen (TYLENOL) 500 MG tablet Take 1,000 mg by mouth every 6 (six) hours as needed for moderate pain or headache.     ALPRAZolam (XANAX) 0.25 MG tablet Take 1/2-1 tablet three times daily as needed 90 tablet 1   Cholecalciferol (VITAMIN D) 50 MCG (2000 UT) CAPS Take by mouth. (Patient not taking: Reported on 07/20/2021)     DULoxetine (CYMBALTA) 30 MG capsule Take 1 capsule (30 mg total) by mouth daily for 7 days. Then stop. 7 capsule 0   Multiple Vitamin (MULTIVITAMIN) tablet Take 1 tablet by mouth daily. (Patient  not taking: Reported on 07/20/2021)     Multiple Vitamins-Minerals (ZINC PO) Take by mouth. (Patient not taking: Reported on 07/20/2021)     No current facility-administered medications for this visit.    Medication Side Effects: None  Allergies:  Allergies  Allergen Reactions   Latuda [Lurasidone] Shortness Of Breath    Tingling in hands     Past Medical History:  Diagnosis Date    Anxiety    Hypothyroid     Past Medical History, Surgical history, Social history, and Family history were reviewed and updated as appropriate.   Please see review of systems for further details on the patient's review from today.   Objective:   Physical Exam:  There were no vitals taken for this visit.  Physical Exam Constitutional:      General: She is not in acute distress. Musculoskeletal:        General: No deformity.  Neurological:     Mental Status: She is alert and oriented to person, place, and time.     Coordination: Coordination normal.  Psychiatric:        Attention and Perception: Attention and perception normal. She does not perceive auditory or visual hallucinations.        Mood and Affect: Mood is anxious. Affect is not labile, blunt, angry or inappropriate.        Speech: Speech normal.        Behavior: Behavior is slowed.        Thought Content: Thought content normal. Thought content is not paranoid or delusional. Thought content does not include homicidal or suicidal ideation. Thought content does not include homicidal or suicidal plan.        Cognition and Memory: Cognition and memory normal.        Judgment: Judgment normal.     Comments: Insight intact Anxious affect. Presents as tense. Casually dressed. Minimal grooming.    Lab Review:     Component Value Date/Time   NA 137 05/02/2019 1631   K 4.6 05/02/2019 1631   CL 102 05/02/2019 1631   CO2 23 05/02/2019 1631   GLUCOSE 97 05/02/2019 1631   BUN 7 05/02/2019 1631   CREATININE 0.80 05/02/2019 1631   CALCIUM 9.3 05/02/2019 1631   PROT 7.5 05/02/2019 1631   ALBUMIN 4.4 05/02/2019 1631   AST 21 05/02/2019 1631   ALT 26 05/02/2019 1631   ALKPHOS 34 (L) 05/02/2019 1631   BILITOT 0.8 05/02/2019 1631   GFRNONAA >60 05/02/2019 1631   GFRAA >60 05/02/2019 1631       Component Value Date/Time   WBC 19.7 (H) 05/03/2019 0329   RBC 4.93 05/03/2019 0329   HGB 13.9 05/03/2019 0329   HCT 42.7  05/03/2019 0329   PLT 285 05/03/2019 0329   MCV 86.6 05/03/2019 0329   MCH 28.2 05/03/2019 0329   MCHC 32.6 05/03/2019 0329   RDW 12.8 05/03/2019 0329   LYMPHSABS 0.8 05/03/2019 0329   MONOABS 0.1 05/03/2019 0329   EOSABS 0.0 05/03/2019 0329   BASOSABS 0.0 05/03/2019 0329    No results found for: POCLITH, LITHIUM   No results found for: PHENYTOIN, PHENOBARB, VALPROATE, CBMZ   .res Assessment: Plan:    Pt seen for 45 minutes and time spent discussing treatment options for exacerbation of anxiety s/s and possible leave of absence from work since she reports difficulty functioning at work and at home due to severe anxiety. Discussed considering continuous or intermittent leave. Pt reports that she plans to see  PCP on 07/21/21 and request that PCP rule out any medical causes for acute anxiety.  Discussed alternative treatment options since she reports that Cymbalta is no longer effective for her anxiety. Discussed potential benefits, risks, and side effects of Paxil since it is indicated for OCD, she may have taken it in the remote past and tolerated it, and her mother has had a good response to Paxil. Pt agrees to trial of Paxil. Discussed cross-titration of medications to minimize risk of discontinuation and possible side effects.  Will decrease Cymbalta to 30 mg daily for one week, then stop.  Will start Paxil 20 mg daily for one week, then increase to 30 mg daily for anxiety. Will send in scripts for #7 of the 20 mg tabs and #30 of the 30 mg tabs since pt prefers not to split tabs since this may exacerbate anxiety.  Discussed using a benzodiazepine to stabilize acute anxiety. She recalls taking Alprazolam in the past with adequate response. Will re-start Alprazolam for anxiety. Recommend taking Alprazolam three times daily, if able to tolerate, for the next 3-5 days to stabilize acute anxiety and then reducing frequency once anxiety improves.  Recommend continuing therapy with Stevphen MeuseHolly Ingram,  [facility]. Time also spent coordinating care with therapist.  Pt to follow-up in 3 weeks or sooner if clinically indicated.  Patient advised to contact office with any questions, adverse effects, or acute worsening in signs and symptoms.   Amanda Gilmore was seen today for anxiety.  Diagnoses and all orders for this visit:  Obsessive-compulsive disorder, unspecified type -     ALPRAZolam (XANAX) 0.25 MG tablet; Take 1/2-1 tablet three times daily as needed -     PARoxetine (PAXIL) 20 MG tablet; Take 1 tablet at bedtime for one week, then start 30 mg tabs -     PARoxetine (PAXIL) 30 MG tablet; Take 1 tablet (30 mg total) by mouth daily. Start after taking 20 mg daily for one week -     DULoxetine (CYMBALTA) 30 MG capsule; Take 1 capsule (30 mg total) by mouth daily for 7 days. Then stop.  Generalized anxiety disorder -     ALPRAZolam (XANAX) 0.25 MG tablet; Take 1/2-1 tablet three times daily as needed -     PARoxetine (PAXIL) 20 MG tablet; Take 1 tablet at bedtime for one week, then start 30 mg tabs -     PARoxetine (PAXIL) 30 MG tablet; Take 1 tablet (30 mg total) by mouth daily. Start after taking 20 mg daily for one week -     DULoxetine (CYMBALTA) 30 MG capsule; Take 1 capsule (30 mg total) by mouth daily for 7 days. Then stop.     Please see After Visit Summary for patient specific instructions.  Future Appointments  Date Time Provider Department Center  08/03/2021  9:00 AM Stevphen Meusengram, Holly, [facility] CP[facility]CP None  08/12/2021 11:30 AM Corie Chiquitoarter, Velma Hanna, PMHNP CP-CP None  09/05/2021  8:00 AM Stevphen MeuseIngram, Holly[facility] None    No orders of the defined types were placed in this encounter.   -------------------------------

## 2021-07-21 ENCOUNTER — Other Ambulatory Visit: Payer: Self-pay | Admitting: Psychiatry

## 2021-07-22 ENCOUNTER — Telehealth: Payer: Self-pay | Admitting: Psychiatry

## 2021-07-22 NOTE — Telephone Encounter (Signed)
Received after hours call from patient requesting clarification regarding medication.   She reports that she has been feeling "so,so." She reports that she has not yet taken Alprazolam prn.   She asks about taking Cymbalta and Paxil at the same time and asks about risk of serotonin syndrome. Discussed that risk of serotonin syndrome is considered to be low since she will be taking low doses of both medications for one week. Discussed that serotonin syndrome is rare and typically only occurs with very high doses of multiple serotonergic medications. Discussed cross-titration of medications to minimize risk of discontinuation and possible side effects.  Recommend trying to take Alprazolam 0.25 mg one tablet three times daily over the weekend to stabilize acute anxiety and then reducing if needed due to drowsiness at the start of next week.   She reports that she is unsure if she will be able to work next week and would like to wait to see how she feels after medication changes this weekend. Advised to contact office if s/s do not improve or worsen and will interfere with being able to function at work.   Patient advised to contact office with any questions, adverse effects, or acute worsening in signs and symptoms.

## 2021-08-03 ENCOUNTER — Ambulatory Visit (INDEPENDENT_AMBULATORY_CARE_PROVIDER_SITE_OTHER): Payer: 59 | Admitting: Psychiatry

## 2021-08-03 DIAGNOSIS — F429 Obsessive-compulsive disorder, unspecified: Secondary | ICD-10-CM | POA: Diagnosis not present

## 2021-08-03 NOTE — Progress Notes (Signed)
[facility] Counselor/Therapist Progress Note  Patient ID: Amanda Gilmore, MRN: [id number],    Date: 08/03/2021  Time Spent: 47 minutes start time 9:01 AM end time 9:48 AM Virtual Visit via Telehealth Note (Phone) Connected with patient by a telemedicine/telehealth application, with their informed consent, and verified patient privacy and that I am speaking with the correct person using two identifiers. I discussed the limitations, risks, security and privacy concerns of performing psychotherapy and the availability of in person appointments. I also discussed with the patient that there may be a patient responsible charge related to this service. The patient expressed understanding and agreed to proceed. I discussed the treatment planning with the patient. The patient was provided an opportunity to ask questions and all were answered. The patient agreed with the plan and demonstrated an understanding of the instructions. The patient was advised to call  our office if  symptoms worsen or feel they are in a crisis state and need immediate contact.   Therapist Location: home Patient Location: home    Treatment Type: Individual Therapy  Reported Symptoms: anxiety, obsessive thoughts, compulsive behavior, intrusive thoughts, triggered responses, panic, sleep issues, fatigue  Mental Status Exam:  Appearance:   NA     Behavior:  NA  Motor:  NA  Speech/Language:   Normal Rate  Affect:  NA  Mood:  anxious  Thought process:  normal  Thought content:    WNL  Sensory/Perceptual disturbances:    WNL  Orientation:  oriented to person, place, time/date, and situation  Attention:  Good  Concentration:  Good  Memory:  WNL  Fund of knowledge:   Good  Insight:    Good  Judgment:   Good  Impulse Control:  Good   Risk Assessment: Danger to Self:  No Self-injurious Behavior: No Danger to Others: No Duty to Warn:no Physical Aggression / Violence:No  Access to Firearms a concern: No  Gang  Involvement:No   Subjective: Met with patient via phone session.  She shared she is doing some better she was able to touch some things in her kitchen and fix dinner for her family a few times.  Patient shared she is still working from home and that has been helpful she has not left her home except to go to doctors appointments and she was able to go for 1 walk with her family.  Patient was encouraged to try and take daily walks so she is still getting out of the house on a regular basis.  Discussed how she wanted to progress with next week concerning work.  She reported she has still not made a decision on whether or not she wants to use vacation or going on leave but has the paperwork to do so if needed.  Patient reported that she is developing an issue with being around her cat which is difficult because the cat has been very close to her so she is not sure why she thinks is starting now when it does not go outside.  Patient was encouraged to recognize that there May be a great catalyst to help her be able to work on some desensitization and using CBT skills to talk herself through developing a tolerance to be around her cat and then she can apply those skills to other issues.  Agreed that process would not be started until her medication is in her system more since she just started being on a new medication this week.  Patient explained that her husband  gets frustrated when she asked him to be with her when she is doing things like brushing her teeth so that she he can make sure she does not touch the sink or touch her toothbrush to anything.  Patient was encouraged to let her husband know that even though it may be difficult for some of these behaviors currently it may be good for him to say to her that she is safe she is doing everything right it is okay as she is engaging in the behaviors and then to start decreasing the time he spends with her doing the activities to start helping her feel more confident  to do things on her own.  Also encouraged her to start verbally saying things out loud as she is doing them so that she can feel more confident and remind herself later that she has done what she needed to do.  Patient stated she has tried that was certain things and found it helpful.  Agreed that when clinician gets back in the office different handouts from the CBT workbook would be sent to her so that she and her husband can start working on more desensitization and extinguishing these negative behaviors.  Also talked to her about listening to some of Dr. Chrys Racer leaf's podcasts OCD and breaking the neuro cycle of it.  Patient reported feeling much better at the end of session and felt that she had some strategies that she could start working on.  Interventions: Cognitive Behavioral Therapy and Solution-Oriented/Positive Psychology  Diagnosis:   ICD-10-CM   1. Obsessive-compulsive disorder, unspecified type  F42.9       Plan: Patient is to use CBT and coping skills to decrease anxiety and OCD behaviors.  Patient is to talk to her husband about how he can support her during this journey.  Patient is to be sent handouts from the OCD workbook to help her start working on desensitizing and extinguishing certain behaviors.  Patient is to continue taking medication as directed.  Patient is to be worked in for an earlier session to continue working on decreasing patient's intrusive thoughts.  Lina Sayre, [facility]

## 2021-08-12 ENCOUNTER — Encounter: Payer: Self-pay | Admitting: Psychiatry

## 2021-08-12 ENCOUNTER — Other Ambulatory Visit: Payer: Self-pay | Admitting: Psychiatry

## 2021-08-12 ENCOUNTER — Telehealth (INDEPENDENT_AMBULATORY_CARE_PROVIDER_SITE_OTHER): Payer: 59 | Admitting: Psychiatry

## 2021-08-12 DIAGNOSIS — F429 Obsessive-compulsive disorder, unspecified: Secondary | ICD-10-CM

## 2021-08-12 DIAGNOSIS — F411 Generalized anxiety disorder: Secondary | ICD-10-CM

## 2021-08-12 MED ORDER — PAROXETINE HCL 30 MG PO TABS: 30.0000 mg | ORAL_TABLET | Freq: Every day | ORAL | 1 refills | Status: DC

## 2021-08-12 NOTE — Progress Notes (Signed)
Amanda Gilmore PH:[id number] 28-Jan-1976 46 y.o.  Virtual Visit via Video Note  I connected with pt @ on 08/12/21 at 11:30 AM EST by a video enabled telemedicine application and verified that I am speaking with the correct person using two identifiers.   I discussed the limitations of evaluation and management by telemedicine and the availability of in person appointments. The patient expressed understanding and agreed to proceed.  I discussed the assessment and treatment plan with the patient. The patient was provided an opportunity to ask questions and all were answered. The patient agreed with the plan and demonstrated an understanding of the instructions.   The patient was advised to call back or seek an in-person evaluation if the symptoms worsen or if the condition fails to improve as anticipated.  I provided 25 minutes of non-face-to-face time during this encounter.  The patient was located at home.  The provider was located at [place].   Thayer Headings, PMHNP   Subjective:   Patient ID:  Amanda Gilmore is a 46 y.o. (DOB 1976/04/17) female.  Chief Complaint:  Chief Complaint  Patient presents with   Anxiety    HPI Amanda Gilmore presents for follow-up of anxiety and depression. She reports, "I feel like I am making progress." She reports that she was able to cross-titrate from Cymbalta to Paxil without difficulty.  "I can tell a big difference." She reports less rumination. She reports that anxious feeling has decreased. She has been taking Xanax 1/2 once to twice a day. She reports that she continues to have some triggers about things being dirty or contaminated- door knobs, light switches. She reports that she has been able to eat more and has found things that she can make for herself. She reports that she is now eating twice daily. She reports that she does not like to touch foods and uses utensils. She reports that she continues to ask family to wash hands at times.  She reports that she has not been going to very many places. Drove to pick up her son. She reports worry has improved. She has been writing down anxiety triggers. She has also been jotting down accomplishments she has made each day. She reports intrusive thoughts have improved and she is able to pause and evaluate if a thought is rational or not. She reports that her mood has been "pretty good." Denies any significant depression. Energy and motivation depend on sleep qty and quality. Denies difficulty falling asleep. She reports difficulty returning to sleep sometimes if awakened during the night. She feels that she is now a lighter sleeper compared to the past. Denies SI.   She reports that her HR was 135-140 when she was more anxious. She reports that her HR is now in the 70's-80's. Denies any recent panic attacks.   She reports that she is better able to concentrate more.   Supervisor approved her to work from home for a month.   Denies paranoia.   Past Psychiatric Medication Trials: Latuda- Adverse reaction- tingling in hands and SOB Cymbalta- helped with anxiety. "Flat" feeling/affective dulling at 60 mg. Zoloft- heart palpitations Paxil-? Pristiq Viibryd- Side effects. Stopped after 2 weeks.  Trintellix Wellbutrin - Prescribed but did not take Buspar- took briefly Lamictal Xanax  Review of Systems:  Review of Systems  Gastrointestinal: Negative.   Musculoskeletal:  Negative for gait problem.  Neurological:  Negative for tremors and headaches.  Psychiatric/Behavioral:         Please refer  to HPI   Medications: I have reviewed the patient's current medications.  Current Outpatient Medications  Medication Sig Dispense Refill   ALPRAZolam (XANAX) 0.25 MG tablet Take 1/2-1 tablet three times daily as needed 90 tablet 1   levothyroxine (SYNTHROID) 100 MCG tablet Take 100 mcg by mouth daily.     acetaminophen (TYLENOL) 500 MG tablet Take 1,000 mg by mouth every 6 (six) hours as  needed for moderate pain or headache. (Patient not taking: Reported on 08/12/2021)     Cholecalciferol (VITAMIN D) 50 MCG (2000 UT) CAPS Take by mouth. (Patient not taking: Reported on 07/20/2021)     Multiple Vitamin (MULTIVITAMIN) tablet Take 1 tablet by mouth daily. (Patient not taking: Reported on 07/20/2021)     Multiple Vitamins-Minerals (ZINC PO) Take by mouth. (Patient not taking: Reported on 07/20/2021)     PARoxetine (PAXIL) 30 MG tablet Take 1 tablet (30 mg total) by mouth daily. Start after taking 20 mg daily for one week 30 tablet 1   No current facility-administered medications for this visit.    Medication Side Effects: None  Allergies:  Allergies  Allergen Reactions   Latuda [Lurasidone] Shortness Of Breath    Tingling in hands     Past Medical History:  Diagnosis Date   Anxiety    Hypothyroid     Family History  Problem Relation Age of Onset   Hypertension Mother    CAD Father 36       89   Alzheimer's disease Father     Social History   Socioeconomic History   Marital status: Married    Spouse name: Not on file   Number of children: 1   Years of education: Not on file   Highest education level: Not on file  Occupational History   Not on file  Tobacco Use   Smoking status: Never   Smokeless tobacco: Never  Substance and Sexual Activity   Alcohol use: Yes    Alcohol/week: 2.0 standard drinks    Types: 2 Glasses of wine per week   Drug use: Not on file   Sexual activity: Not on file  Other Topics Concern   Not on file  Social History Narrative   Lives at home with husband and son.   Social Determinants of Health   Financial Resource Strain: Not on file  Food Insecurity: Not on file  Transportation Needs: Not on file  Physical Activity: Not on file  Stress: Not on file  Social Connections: Not on file  Intimate Partner Violence: Not on file    Past Medical History, Surgical history, Social history, and Family history were reviewed and updated  as appropriate.   Please see review of systems for further details on the patient's review from today.   Objective:   Physical Exam:  There were no vitals taken for this visit.  Physical Exam Constitutional:      General: She is not in acute distress. Musculoskeletal:        General: No deformity.  Neurological:     Mental Status: She is alert and oriented to person, place, and time.     Coordination: Coordination normal.  Psychiatric:        Attention and Perception: Attention and perception normal. She does not perceive auditory or visual hallucinations.        Mood and Affect: Affect is not labile, blunt, angry or inappropriate.        Speech: Speech normal.  Behavior: Behavior normal.        Thought Content: Thought content normal. Thought content is not paranoid or delusional. Thought content does not include homicidal or suicidal ideation. Thought content does not include homicidal or suicidal plan.        Cognition and Memory: Cognition and memory normal.        Judgment: Judgment normal.     Comments: Insight intact Mood presents as less anxious and less depressed    Lab Review:     Component Value Date/Time   NA 137 05/02/2019 1631   K 4.6 05/02/2019 1631   CL 102 05/02/2019 1631   CO2 23 05/02/2019 1631   GLUCOSE 97 05/02/2019 1631   BUN 7 05/02/2019 1631   CREATININE 0.80 05/02/2019 1631   CALCIUM 9.3 05/02/2019 1631   PROT 7.5 05/02/2019 1631   ALBUMIN 4.4 05/02/2019 1631   AST 21 05/02/2019 1631   ALT 26 05/02/2019 1631   ALKPHOS 34 (L) 05/02/2019 1631   BILITOT 0.8 05/02/2019 1631   GFRNONAA >60 05/02/2019 1631   GFRAA >60 05/02/2019 1631       Component Value Date/Time   WBC 19.7 (H) 05/03/2019 0329   RBC 4.93 05/03/2019 0329   HGB 13.9 05/03/2019 0329   HCT 42.7 05/03/2019 0329   PLT 285 05/03/2019 0329   MCV 86.6 05/03/2019 0329   MCH 28.2 05/03/2019 0329   MCHC 32.6 05/03/2019 0329   RDW 12.8 05/03/2019 0329   LYMPHSABS 0.8  05/03/2019 0329   MONOABS 0.1 05/03/2019 0329   EOSABS 0.0 05/03/2019 0329   BASOSABS 0.0 05/03/2019 0329    No results found for: POCLITH, LITHIUM   No results found for: PHENYTOIN, PHENOBARB, VALPROATE, CBMZ   .res Assessment: Plan:   Pt seen for 30 minutes and time spent discussing response to treatment. Discussed that she is exhibiting significant improvement in anxiety s/s after starting Paxil 3 weeks ago and taking 30 mg dose for approximately 2 weeks. Discussed that she is likely to continue to experience improved mood and anxiety s/s since more time is needed to see full response to medications.  Continue Alprazolam 0.25 mg 1/2-1 tab po TID prn anxiety.  Pt to follow-up with this provider on 09/02/21.  Recommend continuing therapy with Lina Sayre, [facility].  Patient advised to contact office with any questions, adverse effects, or acute worsening in signs and symptoms.   Minelly was seen today for anxiety.  Diagnoses and all orders for this visit:  Generalized anxiety disorder -     PARoxetine (PAXIL) 30 MG tablet; Take 1 tablet (30 mg total) by mouth daily. Start after taking 20 mg daily for one week  Obsessive-compulsive disorder, unspecified type -     PARoxetine (PAXIL) 30 MG tablet; Take 1 tablet (30 mg total) by mouth daily. Start after taking 20 mg daily for one week     Please see After Visit Summary for patient specific instructions.  Future Appointments  Date Time Provider [place]  09/02/2021 11:30 AM Thayer Headings, PMHNP CP-CP None  09/05/2021  8:00 AM Lina Sayre, [facility] CP-CP None    No orders of the defined types were placed in this encounter.     -------------------------------

## 2021-09-02 ENCOUNTER — Encounter: Payer: Self-pay | Admitting: Psychiatry

## 2021-09-02 ENCOUNTER — Other Ambulatory Visit: Payer: Self-pay

## 2021-09-02 ENCOUNTER — Ambulatory Visit (INDEPENDENT_AMBULATORY_CARE_PROVIDER_SITE_OTHER): Payer: 59 | Admitting: Psychiatry

## 2021-09-02 DIAGNOSIS — F429 Obsessive-compulsive disorder, unspecified: Secondary | ICD-10-CM

## 2021-09-02 DIAGNOSIS — F411 Generalized anxiety disorder: Secondary | ICD-10-CM | POA: Diagnosis not present

## 2021-09-02 MED ORDER — PAROXETINE HCL 30 MG PO TABS: 30.0000 mg | ORAL_TABLET | Freq: Every day | ORAL | 1 refills | Status: DC

## 2021-09-02 NOTE — Progress Notes (Signed)
Amanda Gilmore [id number] 1976-05-28 46 y.o.  Subjective:   Patient ID:  Amanda Gilmore is a 46 y.o. (DOB 1975-12-03) female.  Chief Complaint:  Chief Complaint  Patient presents with   Anxiety    HPI [name] presents to the office today for follow-up of anxiety. She reports, "I'm feeling a lot better." She reports that she has not needed to take Xanax much in the last 2 weeks. She took a 1/2 tablet before coming to apt today and a 1/2 tab before going into a supermarket. She went to [employer] alone and had some anxiety. She reports that she had some anxiety with touching items in the store. Now able to touch take out boxes. She wore gloves while she was in the office. She continues not to touch food. Denies panic attacks. She reports generalized anxiety is "not bad." HR went into the 90's at [employer]. Has not had HR in the 100's. HR is usually in the 70's and occ in the 80's. Denies physical s/s with anxiety.   Denies depressed mood. Sleep has been ok. She continues to feel like she is a lighter sleeper compared to the past and is sleeping at least 7 hours. Appetite has been ok. She reports that she has been feeling tired and this may be due to not drinking enough the water. Motivation has been "really good." She reports that she has been tidying up and getting organized at home. She reports that her concentration is somewhat reduced. Denies SI.   She went to work on Wednesday to move some things and Tuesday will be her first day back in the office. She reports that she talked with her boss and was told she could leave if needed.   Reports that her mother and sisters have been supportive. Reports that husband made comment yesterday that she seems to be improving.   Past Psychiatric Medication Trials: Latuda- Adverse reaction- tingling in hands and SOB Cymbalta- helped with anxiety. "Flat" feeling/affective dulling at 60 mg. Zoloft- heart palpitations Paxil-? Pristiq Viibryd- Side  effects. Stopped after 2 weeks.  Trintellix Wellbutrin - Prescribed but did not take Buspar- took briefly Lamictal Xanax    Review of Systems:  Review of Systems  Gastrointestinal: Negative.   Musculoskeletal:  Negative for gait problem.  Neurological:  Negative for tremors and headaches.  Psychiatric/Behavioral:         Please refer to HPI   Medications: I have reviewed the patient's current medications.  Current Outpatient Medications  Medication Sig Dispense Refill   ALPRAZolam (XANAX) 0.25 MG tablet Take 1/2-1 tablet three times daily as needed 90 tablet 1   levothyroxine (SYNTHROID) 100 MCG tablet Take 100 mcg by mouth daily.     acetaminophen (TYLENOL) 500 MG tablet Take 1,000 mg by mouth every 6 (six) hours as needed for moderate pain or headache. (Patient not taking: Reported on 08/12/2021)     Cholecalciferol (VITAMIN D) 50 MCG (2000 UT) CAPS Take by mouth. (Patient not taking: Reported on 07/20/2021)     Multiple Vitamin (MULTIVITAMIN) tablet Take 1 tablet by mouth daily. (Patient not taking: Reported on 07/20/2021)     Multiple Vitamins-Minerals (ZINC PO) Take by mouth. (Patient not taking: Reported on 07/20/2021)     PARoxetine (PAXIL) 30 MG tablet Take 1 tablet (30 mg total) by mouth daily. 30 tablet 1   No current facility-administered medications for this visit.    Medication Side Effects: None  Allergies:  Allergies  Allergen Reactions  Latuda [Lurasidone] Shortness Of Breath    Tingling in hands     Past Medical History:  Diagnosis Date   Anxiety    Hypothyroid     Past Medical History, Surgical history, Social history, and Family history were reviewed and updated as appropriate.   Please see review of systems for further details on the patient's review from today.   Objective:   Physical Exam:  There were no vitals taken for this visit.  Physical Exam Constitutional:      General: She is not in acute distress. Musculoskeletal:        General: No  deformity.  Neurological:     Mental Status: She is alert and oriented to person, place, and time.     Coordination: Coordination normal.  Psychiatric:        Attention and Perception: Attention and perception normal. She does not perceive auditory or visual hallucinations.        Mood and Affect: Mood is anxious. Mood is not depressed. Affect is not labile, blunt, angry or inappropriate.        Speech: Speech normal.        Behavior: Behavior normal.        Thought Content: Thought content normal. Thought content is not paranoid or delusional. Thought content does not include homicidal or suicidal ideation. Thought content does not include homicidal or suicidal plan.        Cognition and Memory: Cognition and memory normal.        Judgment: Judgment normal.     Comments: Insight intact Mood continues to present as less anxious    Lab Review:     Component Value Date/Time   NA 137 05/02/2019 1631   K 4.6 05/02/2019 1631   CL 102 05/02/2019 1631   CO2 23 05/02/2019 1631   GLUCOSE 97 05/02/2019 1631   BUN 7 05/02/2019 1631   CREATININE 0.80 05/02/2019 1631   CALCIUM 9.3 05/02/2019 1631   PROT 7.5 05/02/2019 1631   ALBUMIN 4.4 05/02/2019 1631   AST 21 05/02/2019 1631   ALT 26 05/02/2019 1631   ALKPHOS 34 (L) 05/02/2019 1631   BILITOT 0.8 05/02/2019 1631   GFRNONAA >60 05/02/2019 1631   GFRAA >60 05/02/2019 1631       Component Value Date/Time   WBC 19.7 (H) 05/03/2019 0329   RBC 4.93 05/03/2019 0329   HGB 13.9 05/03/2019 0329   HCT 42.7 05/03/2019 0329   PLT 285 05/03/2019 0329   MCV 86.6 05/03/2019 0329   MCH 28.2 05/03/2019 0329   MCHC 32.6 05/03/2019 0329   RDW 12.8 05/03/2019 0329   LYMPHSABS 0.8 05/03/2019 0329   MONOABS 0.1 05/03/2019 0329   EOSABS 0.0 05/03/2019 0329   BASOSABS 0.0 05/03/2019 0329    No results found for: POCLITH, LITHIUM   No results found for: PHENYTOIN, PHENOBARB, VALPROATE, CBMZ   .res Assessment: Plan:    Pt seen for 30 minutes  and time spent discussing treatment plan and option to continue Paxil 30 mg or increase to 40 mg daily. She reports that she would like to continue Paxil 30 mg po qd at this time and possibly consider increase in dose in the future.  Will continue Alprazolam 0.25 mg 1/2-1 tab po TID prn anxiety. Discussed that provider could assist with work accommodations if needed since she reports that office is moving and they will now have shared work spaces which could exacerbate concerns about contamination. Discussed that recommendation can be made  for her to have designated workspace if needed.  Recommend continuing therapy with Stevphen Meuse, [facility].  Pt to follow-up in 4 weeks or sooner if clinically indicated.  Patient advised to contact office with any questions, adverse effects, or acute worsening in signs and symptoms.   [place] was seen today for anxiety.  Diagnoses and all orders for this visit:  Generalized anxiety disorder -     PARoxetine (PAXIL) 30 MG tablet; Take 1 tablet (30 mg total) by mouth daily.  Obsessive-compulsive disorder, unspecified type -     PARoxetine (PAXIL) 30 MG tablet; Take 1 tablet (30 mg total) by mouth daily.     Please see After Visit Summary for patient specific instructions.  Future Appointments  Date Time Provider Department Center  09/05/2021  8:00 AM Stevphen Meuse, [facility] CP-CP None  09/30/2021  9:00 AM Corie Chiquito, PMHNP CP-CP None    No orders of the defined types were placed in this encounter.   -------------------------------

## 2021-09-05 ENCOUNTER — Ambulatory Visit: Payer: 59 | Admitting: Psychiatry

## 2021-09-30 ENCOUNTER — Ambulatory Visit: Payer: 59 | Admitting: Psychiatry

## 2021-12-01 ENCOUNTER — Other Ambulatory Visit: Payer: Self-pay | Admitting: Registered Nurse

## 2021-12-01 DIAGNOSIS — Z1231 Encounter for screening mammogram for malignant neoplasm of breast: Secondary | ICD-10-CM

## 2021-12-06 ENCOUNTER — Ambulatory Visit
Admission: RE | Admit: 2021-12-06 | Discharge: 2021-12-06 | Disposition: A | Discharge status: Home / Self Care | Payer: Managed Care, Other (non HMO) | Source: Ambulatory Visit | Attending: Registered Nurse | Admitting: Registered Nurse

## 2021-12-06 DIAGNOSIS — Z1231 Encounter for screening mammogram for malignant neoplasm of breast: Secondary | ICD-10-CM

## 2022-02-16 ENCOUNTER — Other Ambulatory Visit: Payer: Self-pay | Admitting: Psychiatry

## 2022-02-16 DIAGNOSIS — F411 Generalized anxiety disorder: Secondary | ICD-10-CM

## 2022-02-16 DIAGNOSIS — F429 Obsessive-compulsive disorder, unspecified: Secondary | ICD-10-CM

## 2022-02-16 NOTE — Telephone Encounter (Signed)
Please schedule appt

## 2022-02-17 ENCOUNTER — Ambulatory Visit (INDEPENDENT_AMBULATORY_CARE_PROVIDER_SITE_OTHER): Payer: 59 | Admitting: Psychiatry

## 2022-02-17 ENCOUNTER — Encounter: Payer: Self-pay | Admitting: Psychiatry

## 2022-02-17 DIAGNOSIS — F320 Major depressive disorder, single episode, mild: Secondary | ICD-10-CM

## 2022-02-17 DIAGNOSIS — F411 Generalized anxiety disorder: Secondary | ICD-10-CM

## 2022-02-17 DIAGNOSIS — F429 Obsessive-compulsive disorder, unspecified: Secondary | ICD-10-CM | POA: Diagnosis not present

## 2022-02-17 MED ORDER — BUPROPION HCL ER (SR) 100 MG PO TB12: 100.0000 mg | ORAL_TABLET | Freq: Every day | ORAL | 2 refills | Status: DC

## 2022-02-17 MED ORDER — ALPRAZOLAM 0.25 MG PO TABS: ORAL_TABLET | ORAL | 1 refills | Status: DC

## 2022-02-17 MED ORDER — PAROXETINE HCL 30 MG PO TABS: 30.0000 mg | ORAL_TABLET | Freq: Every day | ORAL | 1 refills | Status: DC

## 2022-02-17 NOTE — Telephone Encounter (Signed)
Pt is scheduled for today 8/4

## 2022-02-17 NOTE — Telephone Encounter (Signed)
Appt today

## 2022-02-17 NOTE — Progress Notes (Signed)
ARIZONA SORN [id number] [birth year] 46 y.o.  Subjective:   Patient ID:  Amanda Gilmore is a 46 y.o. (DOB March 01, 1976) female.  Chief Complaint:  Chief Complaint  Patient presents with   Anxiety   Depression   Other    Difficulty with concentration    HPI Amanda Gilmore presents to the office today for follow-up of anxiety, depression, and difficulty with concentration. She reports that she has been doing well overall. Has been able to return to work without difficulty.   She reports that she has had to take Alprazolam prn on a few rare occasions. She reports that she has not had any panic attacks. She reports that obsessive thoughts have been "better." She reports that she has some obsessive thoughts that are not interfering with function. She denies any recent compulsions or intrusive thoughts. She reports that she has some worry and this has returned to baseline. She reports that her mood has been "pretty good." She reports "some" intermittent depression- "it's definitely improved from where it was." Denies excessive irritability. Energy and motivation are low at times. She reports difficulty getting motivated. She reports difficulty with concentration at work and is easily distracted. Difficulty with getting organized. She notices some distractibility at home as well and difficulty completing tasks. Sleep is "ok." Falls asleep without difficulty and feels tired upon awakening. Estimates sleeping 5 "solid hours" and then having interrupted sleep after that. Appetite has been good and is no longer avoiding certain foods. Denies anhedonia. Enjoyed beach trip and time with family. Going places. Denies SI.   Works hybrid schedule. Denies any significant changes or stressors.   Xanax last filled 07/20/21.   Past Psychiatric Medication Trials: Latuda- Adverse reaction- tingling in hands and SOB Cymbalta- helped with anxiety. "Flat" feeling/affective dulling at 60 mg. Zoloft- heart  palpitations Paxil Pristiq Viibryd- Side effects. Stopped after 2 weeks.  Trintellix Wellbutrin - Prescribed but did not take Buspar- took briefly Lamictal Xanax  Review of Systems:  Review of Systems  Gastrointestinal: Negative.   Musculoskeletal:  Positive for back pain. Negative for gait problem.  Neurological:  Positive for headaches. Negative for tremors.  Psychiatric/Behavioral:         Please refer to HPI   Has been keeping a headache journal. Having migraines and headaches.  Medications: I have reviewed the patient's current medications.  Current Outpatient Medications  Medication Sig Dispense Refill   acetaminophen (TYLENOL) 500 MG tablet Take 1,000 mg by mouth every 6 (six) hours as needed for moderate pain or headache.     buPROPion ER (WELLBUTRIN SR) 100 MG 12 hr tablet Take 1 tablet (100 mg total) by mouth daily. 30 tablet 2   levothyroxine (SYNTHROID) 100 MCG tablet Take 100 mcg by mouth daily.     ALPRAZolam (XANAX) 0.25 MG tablet Take 1/2-1 tablet three times daily as needed 90 tablet 1   Cholecalciferol (VITAMIN D) 50 MCG (2000 UT) CAPS Take by mouth. (Patient not taking: Reported on 07/20/2021)     Multiple Vitamin (MULTIVITAMIN) tablet Take 1 tablet by mouth daily. (Patient not taking: Reported on 07/20/2021)     Multiple Vitamins-Minerals (ZINC PO) Take by mouth. (Patient not taking: Reported on 07/20/2021)     PARoxetine (PAXIL) 30 MG tablet Take 1 tablet (30 mg total) by mouth daily. 30 tablet 1   No current facility-administered medications for this visit.    Medication Side Effects: None  Allergies:  Allergies  Allergen Reactions   Latuda [Lurasidone] Shortness Of  Breath    Tingling in hands     Past Medical History:  Diagnosis Date   Anxiety    Hypothyroid     Past Medical History, Surgical history, Social history, and Family history were reviewed and updated as appropriate.   Please see review of systems for further details on the patient's  review from today.   Objective:   Physical Exam:  There were no vitals taken for this visit.  Physical Exam Constitutional:      General: She is not in acute distress. Musculoskeletal:        General: No deformity.  Neurological:     Mental Status: She is alert and oriented to person, place, and time.     Coordination: Coordination normal.  Psychiatric:        Attention and Perception: Attention and perception normal. She does not perceive auditory or visual hallucinations.        Mood and Affect: Affect is not labile, blunt, angry or inappropriate.        Speech: Speech normal.        Behavior: Behavior normal.        Thought Content: Thought content normal. Thought content is not paranoid or delusional. Thought content does not include homicidal or suicidal ideation. Thought content does not include homicidal or suicidal plan.        Cognition and Memory: Cognition and memory normal.        Judgment: Judgment normal.     Comments: Insight intact Mood is mildly depressed and anxious     Lab Review:     Component Value Date/Time   NA 137 05/02/2019 1631   K 4.6 05/02/2019 1631   CL 102 05/02/2019 1631   CO2 23 05/02/2019 1631   GLUCOSE 97 05/02/2019 1631   BUN 7 05/02/2019 1631   CREATININE 0.80 05/02/2019 1631   CALCIUM 9.3 05/02/2019 1631   PROT 7.5 05/02/2019 1631   ALBUMIN 4.4 05/02/2019 1631   AST 21 05/02/2019 1631   ALT 26 05/02/2019 1631   ALKPHOS 34 (L) 05/02/2019 1631   BILITOT 0.8 05/02/2019 1631   GFRNONAA >60 05/02/2019 1631   GFRAA >60 05/02/2019 1631       Component Value Date/Time   WBC 19.7 (H) 05/03/2019 0329   RBC 4.93 05/03/2019 0329   HGB 13.9 05/03/2019 0329   HCT 42.7 05/03/2019 0329   PLT 285 05/03/2019 0329   MCV 86.6 05/03/2019 0329   MCH 28.2 05/03/2019 0329   MCHC 32.6 05/03/2019 0329   RDW 12.8 05/03/2019 0329   LYMPHSABS 0.8 05/03/2019 0329   MONOABS 0.1 05/03/2019 0329   EOSABS 0.0 05/03/2019 0329   BASOSABS 0.0 05/03/2019  0329    No results found for: "POCLITH", "LITHIUM"   No results found for: "PHENYTOIN", "PHENOBARB", "VALPROATE", "CBMZ"   .res Assessment: Plan:    Patient seen for 30 minutes and time spent discussing her concerns about difficulty with concentration.  Discussed different possible causes of impaired concentration.  Discussed potential benefits, risks, and side effects of Wellbutrin.  Discussed that Wellbutrin is approved for treatment of depression and is used off label for treatment of attention deficit.  Discussed that Wellbutrin targets dopamine and typically is helpful for energy, motivation, depression, and low mood.  Patient agrees to trial of Wellbutrin.  Will start with low dose Wellbutrin SR 100 mg in the morning due to history of adverse effects with several medications.  Discussed that Wellbutrin SR could be stopped if she  experiences intolerable side effects. Continue Paxil 30 mg daily for anxiety and depression.  She reports that her insurance is now requiring use of mail order pharmacy for maintenance medications.  She reports that she will call office when she confirms which mail order pharmacy to use.  Will send prescription to be placed on file at local retail pharmacy in the event that there are any delays with mail order. Continue alprazolam as needed for anxiety. Patient to follow-up in 6 weeks or sooner if clinically indicated.  Discussed that follow-up could be extended further based on response to initiation of Wellbutrin SR. Patient advised to contact office with any questions, adverse effects, or acute worsening in signs and symptoms.    Amanda Gilmore was seen today for anxiety, depression and other.  Diagnoses and all orders for this visit:  Current mild episode of major depressive disorder without prior episode (HCC) -     buPROPion ER (WELLBUTRIN SR) 100 MG 12 hr tablet; Take 1 tablet (100 mg total) by mouth daily.  Generalized anxiety disorder -     ALPRAZolam (XANAX)  0.25 MG tablet; Take 1/2-1 tablet three times daily as needed -     PARoxetine (PAXIL) 30 MG tablet; Take 1 tablet (30 mg total) by mouth daily.  Obsessive-compulsive disorder, unspecified type -     ALPRAZolam (XANAX) 0.25 MG tablet; Take 1/2-1 tablet three times daily as needed -     PARoxetine (PAXIL) 30 MG tablet; Take 1 tablet (30 mg total) by mouth daily.     Please see After Visit Summary for patient specific instructions.  Future Appointments  Date Time Provider Department Center  03/31/2022  8:30 AM Corie Chiquito, PMHNP [facility] None    No orders of the defined types were placed in this encounter.   -------------------------------

## 2022-02-22 ENCOUNTER — Telehealth: Payer: Self-pay | Admitting: Psychiatry

## 2022-02-22 DIAGNOSIS — F411 Generalized anxiety disorder: Secondary | ICD-10-CM

## 2022-02-22 DIAGNOSIS — F429 Obsessive-compulsive disorder, unspecified: Secondary | ICD-10-CM

## 2022-02-22 MED ORDER — PAROXETINE HCL 30 MG PO TABS: 30.0000 mg | ORAL_TABLET | Freq: Every day | ORAL | 0 refills | Status: DC

## 2022-02-22 NOTE — Telephone Encounter (Signed)
Pt LVM @ 12:44p.  She said she wanted to talk to someone about medication that Shanda Bumps prescribed for her last week.  Next appt 9/15

## 2022-02-22 NOTE — Telephone Encounter (Signed)
Patient said she needed a 90-day supply of Paxil to be covered by insurance. Rx sent to the requested pharmacy.

## 2022-03-11 ENCOUNTER — Other Ambulatory Visit: Payer: Self-pay | Admitting: Psychiatry

## 2022-03-11 DIAGNOSIS — F320 Major depressive disorder, single episode, mild: Secondary | ICD-10-CM

## 2022-03-31 ENCOUNTER — Ambulatory Visit: Payer: 59 | Admitting: Psychiatry

## 2022-04-14 ENCOUNTER — Ambulatory Visit: Payer: 59 | Admitting: Psychiatry

## 2022-04-19 ENCOUNTER — Encounter: Payer: Self-pay | Admitting: Psychiatry

## 2022-04-19 ENCOUNTER — Ambulatory Visit (INDEPENDENT_AMBULATORY_CARE_PROVIDER_SITE_OTHER): Payer: 59 | Admitting: Psychiatry

## 2022-04-19 DIAGNOSIS — F411 Generalized anxiety disorder: Secondary | ICD-10-CM | POA: Diagnosis not present

## 2022-04-19 DIAGNOSIS — F429 Obsessive-compulsive disorder, unspecified: Secondary | ICD-10-CM | POA: Diagnosis not present

## 2022-04-19 DIAGNOSIS — F320 Major depressive disorder, single episode, mild: Secondary | ICD-10-CM

## 2022-04-19 MED ORDER — PAROXETINE HCL 30 MG PO TABS: 30.0000 mg | ORAL_TABLET | Freq: Every day | ORAL | 1 refills | Status: DC

## 2022-04-19 MED ORDER — BUPROPION HCL ER (SR) 100 MG PO TB12: 100.0000 mg | ORAL_TABLET | Freq: Every day | ORAL | 1 refills | Status: DC

## 2022-04-19 NOTE — Progress Notes (Signed)
Amanda Gilmore [id number] 01-06-1976 46 y.o.  Subjective:   Patient ID:  Amanda Gilmore is a 46 y.o. (DOB 01-25-1976) female.  Chief Complaint:  Chief Complaint  Patient presents with   Follow-up    Anxiety, depression    HPI Amanda Gilmore presents to the office today for follow-up of anxiety, depression, and insomnia.   She denies any side effects with Wellbutrin. She reports that it has helped with mood and focus. Energy is "so,so." Motivation is fair. Concentration is improved but not where she would like it to be. She reports that her anxiety has been well controlled and that Wellbutrin did not worsen anxiety. She has not needed Xanax prn recently. Occ notices obsessive thoughts and compulsions and "I talk myself through it." Describes sleep as "not great... not sound... don't wake up feeling rested." Sleeping about 6 hours. Appetite has been good. Denies anhedonia. Denies SI.   She continues to work a hybrid schedule.   Her son and husband were recently sick. Mother has COPD which is progressing and limited her activities. Sister is mother's full-time caregiver and she is trying to help when possible.  Denies any major psychosocial changes.   Past Psychiatric Medication Trials: Latuda- Adverse reaction- tingling in hands and SOB Cymbalta- helped with anxiety. "Flat" feeling/affective dulling at 60 mg. Zoloft- heart palpitations Paxil Pristiq Viibryd- Side effects. Stopped after 2 weeks.  Trintellix Wellbutrin - Prescribed but did not take Buspar- took briefly Lamictal Xanax     Review of Systems:  Review of Systems  Musculoskeletal:  Positive for back pain. Negative for gait problem.  Neurological:  Negative for tremors and headaches.  Psychiatric/Behavioral:         Please refer to HPI    Medications: I have reviewed the patient's current medications.  Current Outpatient Medications  Medication Sig Dispense Refill   acetaminophen (TYLENOL) 500 MG tablet Take  1,000 mg by mouth every 6 (six) hours as needed for moderate pain or headache.     levothyroxine (SYNTHROID) 100 MCG tablet Take 100 mcg by mouth daily.     ALPRAZolam (XANAX) 0.25 MG tablet Take 1/2-1 tablet three times daily as needed 90 tablet 1   buPROPion ER (WELLBUTRIN SR) 100 MG 12 hr tablet Take 1 tablet (100 mg total) by mouth daily. 90 tablet 1   Cholecalciferol (VITAMIN D) 50 MCG (2000 UT) CAPS Take by mouth. (Patient not taking: Reported on 07/20/2021)     Multiple Vitamin (MULTIVITAMIN) tablet Take 1 tablet by mouth daily. (Patient not taking: Reported on 07/20/2021)     Multiple Vitamins-Minerals (ZINC PO) Take by mouth. (Patient not taking: Reported on 07/20/2021)     PARoxetine (PAXIL) 30 MG tablet Take 1 tablet (30 mg total) by mouth daily. 90 tablet 1   No current facility-administered medications for this visit.    Medication Side Effects: None  Allergies:  Allergies  Allergen Reactions   Latuda [Lurasidone] Shortness Of Breath    Tingling in hands     Past Medical History:  Diagnosis Date   Anxiety    Hypothyroid    Scoliosis     Past Medical History, Surgical history, Social history, and Family history were reviewed and updated as appropriate.   Please see review of systems for further details on the patient's review from today.   Objective:   Physical Exam:  There were no vitals taken for this visit.  Physical Exam  Lab Review:     Component Value Date/Time  NA 137 05/02/2019 1631   K 4.6 05/02/2019 1631   CL 102 05/02/2019 1631   CO2 23 05/02/2019 1631   GLUCOSE 97 05/02/2019 1631   BUN 7 05/02/2019 1631   CREATININE 0.80 05/02/2019 1631   CALCIUM 9.3 05/02/2019 1631   PROT 7.5 05/02/2019 1631   ALBUMIN 4.4 05/02/2019 1631   AST 21 05/02/2019 1631   ALT 26 05/02/2019 1631   ALKPHOS 34 (L) 05/02/2019 1631   BILITOT 0.8 05/02/2019 1631   GFRNONAA >60 05/02/2019 1631   GFRAA >60 05/02/2019 1631       Component Value Date/Time   WBC 19.7  (H) 05/03/2019 0329   RBC 4.93 05/03/2019 0329   HGB 13.9 05/03/2019 0329   HCT 42.7 05/03/2019 0329   PLT 285 05/03/2019 0329   MCV 86.6 05/03/2019 0329   MCH 28.2 05/03/2019 0329   MCHC 32.6 05/03/2019 0329   RDW 12.8 05/03/2019 0329   LYMPHSABS 0.8 05/03/2019 0329   MONOABS 0.1 05/03/2019 0329   EOSABS 0.0 05/03/2019 0329   BASOSABS 0.0 05/03/2019 0329    No results found for: "POCLITH", "LITHIUM"   No results found for: "PHENYTOIN", "PHENOBARB", "VALPROATE", "CBMZ"   .res Assessment: Plan:   She reports that she would like to continue current medications without changes at this time. Discussed considering increase in Bupropion to XL 150 mg if needed in the the future (ie., mood, energy, and/or motivation remain low).  Will continue Bupropion SR 100 mg in the morning for depression.  Continue Paxil 30 mg po qd for depression and anxiety.  Continue Xanax 0.25 mg 1/2-1 tab po TID prn anxiety.  Pt to follow-up in 6 months or sooner if clinically indicated.  Patient advised to contact office with any questions, adverse effects, or acute worsening in signs and symptoms.   Konica was seen today for follow-up.  Diagnoses and all orders for this visit:  Current mild episode of major depressive disorder without prior episode (HCC) -     buPROPion ER (WELLBUTRIN SR) 100 MG 12 hr tablet; Take 1 tablet (100 mg total) by mouth daily.  Generalized anxiety disorder -     PARoxetine (PAXIL) 30 MG tablet; Take 1 tablet (30 mg total) by mouth daily.  Obsessive-compulsive disorder, unspecified type -     PARoxetine (PAXIL) 30 MG tablet; Take 1 tablet (30 mg total) by mouth daily.     Please see After Visit Summary for patient specific instructions.  Future Appointments  Date Time Provider Department Center  10/18/2022  8:30 AM Corie Chiquito, PMHNP [facility] None    No orders of the defined types were placed in this encounter.   -------------------------------

## 2022-10-18 ENCOUNTER — Ambulatory Visit (INDEPENDENT_AMBULATORY_CARE_PROVIDER_SITE_OTHER): Payer: 59 | Admitting: Psychiatry

## 2022-10-18 ENCOUNTER — Telehealth: Payer: Self-pay | Admitting: Psychiatry

## 2022-10-18 ENCOUNTER — Encounter: Payer: Self-pay | Admitting: Psychiatry

## 2022-10-18 VITALS — Wt 189.0 lb

## 2022-10-18 DIAGNOSIS — F429 Obsessive-compulsive disorder, unspecified: Secondary | ICD-10-CM

## 2022-10-18 DIAGNOSIS — F411 Generalized anxiety disorder: Secondary | ICD-10-CM | POA: Diagnosis not present

## 2022-10-18 DIAGNOSIS — F324 Major depressive disorder, single episode, in partial remission: Secondary | ICD-10-CM

## 2022-10-18 MED ORDER — PAROXETINE HCL 30 MG PO TABS
30.0000 mg | ORAL_TABLET | Freq: Every day | ORAL | 1 refills | Status: DC
Start: 2022-10-18 — End: 2022-11-09

## 2022-10-18 MED ORDER — BUPROPION HCL ER (SR) 100 MG PO TB12
100.0000 mg | ORAL_TABLET | Freq: Every day | ORAL | 1 refills | Status: DC
Start: 2022-10-18 — End: 2023-05-11

## 2022-10-18 NOTE — Telephone Encounter (Signed)
Referral has been sent to [facility] for a home sleep study

## 2022-10-18 NOTE — Progress Notes (Signed)
ANGE LABLANC [id number] 31-Oct-1975 47 y.o.  Subjective:   Patient ID:  Amanda Gilmore is a 47 y.o. (DOB 05-29-1976) female.  Chief Complaint:  Chief Complaint  Patient presents with   Follow-up    Anxiety, depression, sleep disturbance    HPI Amanda Gilmore presents to the office today for follow-up of anxiety, depression, and sleep disturbance.   She reports she is doing "very good." She reports that overall her anxiety is well controlled. She reports that she is "a germaphobe in general, but it's much better" and not interfering with quality of life. She reports that she checks to make sure everything is turned off and unplugged before she leaves, and this has been long-standing. She finds it helpful to take pictures of things being turned off or unplugged as reassurance. She reports that her husband recently commented that she is no longer anxious about things that would have caused her anxiety in the past. She reports occasional sadness in response to mother's health issues and denies persistent depressed mood. Sleep is "not great." She reports typically getting "about 5 hours" of sleep and sleep is poor after that. She reports that she is a light sleeper and wakes up during the night when husband wakes up. Wakes up around 6:30 -7 am. Denies nightmares. Denies restlessness at night. She reports, "I have developed snoring and I wonder if it is because of my weight." Not aware of apnea. She reports that she is "not a morning person and always tired in the mornings." She reports that she does not feel rested in the morning. She reports that she has been less physically active since the pandemic. She reports that her food intake may be higher. Energy is fair. Motivation is "not great." Concentration is "so,so." Her work space is enclosed. Denies SI.   She reports that there have been some layoffs and her workload has increased. She has been going into the office 3 days a week.   Mother's health  is worsening. Mother has cancer, COPD, and other health issues. Mother lives 5 minutes away from her. Her sister is mother's caregiver.   Has not taken Xanax prn recently.   Past Psychiatric Medication Trials: Latuda- Adverse reaction- tingling in hands and SOB Cymbalta- helped with anxiety. "Flat" feeling/affective dulling at 60 mg. Zoloft- heart palpitations Paxil Pristiq Viibryd- Side effects. Stopped after 2 weeks.  Trintellix Wellbutrin - Prescribed but did not take Buspar- took briefly Lamictal Xanax    Review of Systems:  Review of Systems  Musculoskeletal:  Positive for back pain. Negative for gait problem.  Allergic/Immunologic: Positive for environmental allergies.  Neurological:  Negative for tremors and headaches.  Psychiatric/Behavioral:         Please refer to HPI    Medications: I have reviewed the patient's current medications.  Current Outpatient Medications  Medication Sig Dispense Refill   acetaminophen (TYLENOL) 500 MG tablet Take 1,000 mg by mouth every 6 (six) hours as needed for moderate pain or headache.     aspirin-acetaminophen-caffeine (EXCEDRIN MIGRAINE) 250-250-65 MG tablet Take by mouth every 6 (six) hours as needed for headache.     cetirizine (ZYRTEC) 10 MG tablet Take 10 mg by mouth daily.     levothyroxine (SYNTHROID) 100 MCG tablet Take 100 mcg by mouth daily.     ALPRAZolam (XANAX) 0.25 MG tablet Take 1/2-1 tablet three times daily as needed (Patient not taking: Reported on 10/18/2022) 90 tablet 1   buPROPion ER (WELLBUTRIN SR) 100 MG  12 hr tablet Take 1 tablet (100 mg total) by mouth daily. 90 tablet 1   Cholecalciferol (VITAMIN D) 50 MCG (2000 UT) CAPS Take by mouth. (Patient not taking: Reported on 07/20/2021)     Multiple Vitamin (MULTIVITAMIN) tablet Take 1 tablet by mouth daily. (Patient not taking: Reported on 07/20/2021)     Multiple Vitamins-Minerals (ZINC PO) Take by mouth. (Patient not taking: Reported on 07/20/2021)     PARoxetine  (PAXIL) 30 MG tablet Take 1 tablet (30 mg total) by mouth daily. 90 tablet 1   No current facility-administered medications for this visit.    Medication Side Effects: Other: Possible weight gain  Allergies:  Allergies  Allergen Reactions   Latuda [Lurasidone] Shortness Of Breath    Tingling in hands     Past Medical History:  Diagnosis Date   Anxiety    Hypothyroid    Scoliosis     Past Medical History, Surgical history, Social history, and Family history were reviewed and updated as appropriate.   Please see review of systems for further details on the patient's review from today.   Objective:   Physical Exam:  Wt 189 lb (85.7 kg)   BMI 29.60 kg/m   Physical Exam Constitutional:      General: She is not in acute distress. Musculoskeletal:        General: No deformity.  Neurological:     Mental Status: She is alert and oriented to person, place, and time.     Coordination: Coordination normal.  Psychiatric:        Attention and Perception: Attention and perception normal. She does not perceive auditory or visual hallucinations.        Mood and Affect: Mood normal. Mood is not anxious or depressed. Affect is not labile, blunt, angry or inappropriate.        Speech: Speech normal.        Behavior: Behavior normal.        Thought Content: Thought content normal. Thought content is not paranoid or delusional. Thought content does not include homicidal or suicidal ideation. Thought content does not include homicidal or suicidal plan.        Cognition and Memory: Cognition and memory normal.        Judgment: Judgment normal.     Comments: Insight intact     Lab Review:     Component Value Date/Time   NA 137 05/02/2019 1631   K 4.6 05/02/2019 1631   CL 102 05/02/2019 1631   CO2 23 05/02/2019 1631   GLUCOSE 97 05/02/2019 1631   BUN 7 05/02/2019 1631   CREATININE 0.80 05/02/2019 1631   CALCIUM 9.3 05/02/2019 1631   PROT 7.5 05/02/2019 1631   ALBUMIN 4.4  05/02/2019 1631   AST 21 05/02/2019 1631   ALT 26 05/02/2019 1631   ALKPHOS 34 (L) 05/02/2019 1631   BILITOT 0.8 05/02/2019 1631   GFRNONAA >60 05/02/2019 1631   GFRAA >60 05/02/2019 1631       Component Value Date/Time   WBC 19.7 (H) 05/03/2019 0329   RBC 4.93 05/03/2019 0329   HGB 13.9 05/03/2019 0329   HCT 42.7 05/03/2019 0329   PLT 285 05/03/2019 0329   MCV 86.6 05/03/2019 0329   MCH 28.2 05/03/2019 0329   MCHC 32.6 05/03/2019 0329   RDW 12.8 05/03/2019 0329   LYMPHSABS 0.8 05/03/2019 0329   MONOABS 0.1 05/03/2019 0329   EOSABS 0.0 05/03/2019 0329   BASOSABS 0.0 05/03/2019 0329  No results found for: "POCLITH", "LITHIUM"   No results found for: "PHENYTOIN", "PHENOBARB", "VALPROATE", "CBMZ"   .res Assessment: Plan:   Pt seen for 30 minutes and time spent discussing sleep disturbance, poor quality sleep, daytime somnolence, and snoring. Discussed that these symptoms may be consistent with sleep apnea and offered to order a home sleep study. Pt is in agreement with completing a sleep study and reports that she attempted a sleep study in the past (likely >5 years ago), however it was incomplete/inconclusive, possibly due to her removing monitors in her sleep. Will order a home sleep study. She has an Epworth Sleepiness Scale Score of 10. Her Neck circumference is 14.5." She is 5'7" and weighs 189 lbs.  Offered to discuss medications to improve sleep, however pt declines and reports that she would prefer to proceed with evaluation and possible treatment of sleep apnea instead.  Discussed that weight gain may be a possible side effect of Paxil and discussed that dose reduction or change in treatment could be considered. She reports that she would like to continue current medication without changes at this time since benefits with mental health being stable outweigh possible weight gain. Will continue Paxil 30 mg daily for anxiety and depression.  Continue Wellbutrin SR 100 mg po qd  for mood.  She reports that she has not needed Alprazolam prn recently and does not need a script at this time. Advised pt to contact office if she needs a new script for Alprazolam prior to next apt.  Pt to follow-up in 6 months or sooner if clinically indicated.  Patient advised to contact office with any questions, adverse effects, or acute worsening in signs and symptoms.   Emaly was seen today for follow-up.  Diagnoses and all orders for this visit:  Obsessive-compulsive disorder, unspecified type -     PARoxetine (PAXIL) 30 MG tablet; Take 1 tablet (30 mg total) by mouth daily.  Major depressive disorder with single episode, in partial remission -     buPROPion ER (WELLBUTRIN SR) 100 MG 12 hr tablet; Take 1 tablet (100 mg total) by mouth daily.  Generalized anxiety disorder -     PARoxetine (PAXIL) 30 MG tablet; Take 1 tablet (30 mg total) by mouth daily.     Please see After Visit Summary for patient specific instructions.  Future Appointments  Date Time Provider [place]  04/18/2023  8:30 AM Thayer Headings, PMHNP [facility] None    No orders of the defined types were placed in this encounter.   -------------------------------

## 2022-11-09 ENCOUNTER — Other Ambulatory Visit: Payer: Self-pay | Admitting: Psychiatry

## 2022-11-09 DIAGNOSIS — F429 Obsessive-compulsive disorder, unspecified: Secondary | ICD-10-CM

## 2022-11-09 DIAGNOSIS — F411 Generalized anxiety disorder: Secondary | ICD-10-CM

## 2022-11-09 MED ORDER — PAROXETINE HCL 30 MG PO TABS
30.0000 mg | ORAL_TABLET | Freq: Every day | ORAL | 0 refills | Status: DC
Start: 2022-11-09 — End: 2023-04-23

## 2022-11-09 MED ORDER — ALPRAZOLAM 0.25 MG PO TABS
ORAL_TABLET | ORAL | 0 refills | Status: AC
Start: 2022-11-09 — End: ?

## 2022-11-09 NOTE — Telephone Encounter (Signed)
Please let her know scripts were sent for a 10-day supply. Please also let her know that some states do not allow pharmacies to fill controlled scripts from out of state prescribers and not sure what [place]'s policy is.

## 2022-11-09 NOTE — Telephone Encounter (Signed)
Pt lvm that she is going out of state for  for 6 days. She forgot her carry on bag which had her medicine in it . So he needs a script of xanax and paxil sent to [facility] located at [address], [place]

## 2022-11-13 ENCOUNTER — Other Ambulatory Visit: Payer: Self-pay | Admitting: Psychiatry

## 2022-11-13 DIAGNOSIS — F429 Obsessive-compulsive disorder, unspecified: Secondary | ICD-10-CM

## 2022-11-13 DIAGNOSIS — F411 Generalized anxiety disorder: Secondary | ICD-10-CM

## 2022-11-15 ENCOUNTER — Other Ambulatory Visit: Payer: Self-pay | Admitting: Registered Nurse

## 2022-11-15 DIAGNOSIS — Z1231 Encounter for screening mammogram for malignant neoplasm of breast: Secondary | ICD-10-CM

## 2022-12-20 ENCOUNTER — Telehealth: Payer: Self-pay | Admitting: Psychiatry

## 2022-12-20 DIAGNOSIS — F429 Obsessive-compulsive disorder, unspecified: Secondary | ICD-10-CM

## 2022-12-20 DIAGNOSIS — F411 Generalized anxiety disorder: Secondary | ICD-10-CM

## 2022-12-20 NOTE — Telephone Encounter (Addendum)
I see that in April a referral was sent to [facility] for a home sleep study, but I don't see any results in Epic.

## 2022-12-20 NOTE — Telephone Encounter (Signed)
Patient lvm at 11:29 stating that she had a sleep study done and hasn't heard anything back regarding it. She would like a rtc to discuss. Ph: [phone]

## 2022-12-22 ENCOUNTER — Ambulatory Visit
Admission: RE | Admit: 2022-12-22 | Discharge: 2022-12-22 | Disposition: A | Discharge status: Home / Self Care | Payer: Managed Care, Other (non HMO) | Source: Ambulatory Visit | Attending: Registered Nurse | Admitting: Registered Nurse

## 2022-12-22 DIAGNOSIS — Z1231 Encounter for screening mammogram for malignant neoplasm of breast: Secondary | ICD-10-CM

## 2022-12-22 NOTE — Telephone Encounter (Signed)
[facility] tried to reach her several time to set up the Sleep Test Study and never able to reach her.  I called and LM with her 11/13/22 to call them to set up the study or to call us if she was going to do this.  On 11/20/22 [facility] informed us that the equipment was shipped to her. They faxed [place] the results on 12/05/22. And in checking Jessica's office box the results are there for review.

## 2022-12-25 MED ORDER — PAROXETINE HCL 10 MG PO TABS
ORAL_TABLET | ORAL | 0 refills | Status: DC
Start: 2022-12-25 — End: 2023-04-23

## 2022-12-25 NOTE — Telephone Encounter (Signed)
Called patient to discuss results of sleep study. Discussed that results indicated that she has mild to moderate sleep apnea. She reports that she has a night guard and did not wear this during sleep study. She reports that she has had gradual weight gain since start Paxil. She is considering trying to lose weight to improve sleep apnea. Discussed options to lower Paxil dose to possibly minimize weight gain and/or considering weight loss clinic. She reports that she is amenable to trial fo decrease in Paxil. Will decrease Paxil to 25 mg daily for 2 weeks, then decrease to 20 mg daily. Script sent for 10 mg tabs. Advised pt to call office with an update on how she is doing with decrease and if she needs script for 20 mg tabs. Also advised pt to call office if she notices worsening anxiety or depression and would like to resume Paxil 30 mg po qd.   Discussed that oral appliances are also used for the treatment of sleep apnea. She plans to follow-up with her dentist's office to ask if her mouth guard for grinding may also help with sleep apnea, or if they would be able to assist her with an oral appliance for sleep apnea.   She reports that she also plans on increasing physical activity to help with weight loss.   Will scan sleep study results to chart for pt's review.

## 2022-12-26 ENCOUNTER — Other Ambulatory Visit: Payer: Self-pay | Admitting: Registered Nurse

## 2022-12-26 DIAGNOSIS — R928 Other abnormal and inconclusive findings on diagnostic imaging of breast: Secondary | ICD-10-CM

## 2022-12-30 ENCOUNTER — Ambulatory Visit
Admission: RE | Admit: 2022-12-30 | Discharge: 2022-12-30 | Disposition: A | Discharge status: Home / Self Care | Payer: Managed Care, Other (non HMO) | Source: Ambulatory Visit | Attending: Registered Nurse | Admitting: Registered Nurse

## 2022-12-30 DIAGNOSIS — R928 Other abnormal and inconclusive findings on diagnostic imaging of breast: Secondary | ICD-10-CM

## 2023-04-13 ENCOUNTER — Other Ambulatory Visit: Payer: Self-pay | Admitting: Psychiatry

## 2023-04-13 DIAGNOSIS — F411 Generalized anxiety disorder: Secondary | ICD-10-CM

## 2023-04-13 DIAGNOSIS — F429 Obsessive-compulsive disorder, unspecified: Secondary | ICD-10-CM

## 2023-04-14 NOTE — Telephone Encounter (Signed)
Call patient to verify dose. Note says 30, but last Rx is showing a reduction.

## 2023-04-18 ENCOUNTER — Ambulatory Visit: Payer: 59 | Admitting: Psychiatry

## 2023-04-20 ENCOUNTER — Telehealth: Payer: Self-pay | Admitting: Psychiatry

## 2023-04-20 NOTE — Telephone Encounter (Signed)
Patient called in for refill on Paroxetine 10mg . PH: [phone] Appt 10/25 Pharmacy [facility] [address] [place], [place]

## 2023-04-23 MED ORDER — PAROXETINE HCL 20 MG PO TABS: 20.0000 mg | ORAL_TABLET | Freq: Every day | ORAL | 0 refills | Status: DC

## 2023-04-23 NOTE — Telephone Encounter (Signed)
Patient is taking 20 mg, sent in Rx for 20 mg tablets.

## 2023-05-11 ENCOUNTER — Ambulatory Visit (INDEPENDENT_AMBULATORY_CARE_PROVIDER_SITE_OTHER): Payer: 59 | Admitting: Psychiatry

## 2023-05-11 ENCOUNTER — Encounter: Payer: Self-pay | Admitting: Psychiatry

## 2023-05-11 DIAGNOSIS — F429 Obsessive-compulsive disorder, unspecified: Secondary | ICD-10-CM | POA: Diagnosis not present

## 2023-05-11 DIAGNOSIS — F411 Generalized anxiety disorder: Secondary | ICD-10-CM

## 2023-05-11 DIAGNOSIS — F324 Major depressive disorder, single episode, in partial remission: Secondary | ICD-10-CM | POA: Diagnosis not present

## 2023-05-11 MED ORDER — BUPROPION HCL ER (SR) 100 MG PO TB12: 100.0000 mg | ORAL_TABLET | Freq: Every day | ORAL | 1 refills | Status: AC

## 2023-05-11 MED ORDER — PAROXETINE HCL 20 MG PO TABS: 20.0000 mg | ORAL_TABLET | Freq: Every day | ORAL | 1 refills | Status: DC

## 2023-05-11 NOTE — Progress Notes (Signed)
Amanda Gilmore [id number] [birth year] 47 y.o.  Subjective:   Patient ID:  Amanda Gilmore is a 47 y.o. (DOB 09-27-1975) female.  Chief Complaint:  Chief Complaint  Patient presents with   Depression   Anxiety    HPI Amanda Gilmore presents to the office today for follow-up of anxiety and depression.   She reports that she reduced Paxil to 20 mg daily and "I can feel a lot more emotion, but with that comes with increased anxiety." She notices increased anxiety on Paxil 20 mg compared to 25 mg daily. She describes increased worry and some situational anxiety. She reports that [place] and noticed some anxiety with this. She has been using coping strategies to manage compulsions. She reports that she is able to talk herself though anxiety. Denies panic attacks. She reports "maybe a little bit" of depression, "it's better than it was." Reports improved motivation. She reports that she is "able to recognize when I am getting into a slump" and then will be intentional about getting out. She reports adequate sleep. Appetite has been good. She reports that she notices she is eating less with dose decrease. Some slight improvement in energy. She reports that her concentration is "terrible." She reports that concentration may be worsening. She reports some difficulty with memory. She reports that no one has mentioned a change in her memory and concentration at work. Denies anhedonia. Denies SI.   She reports that she took Xanax yesterday when she had some anxiety with getting a colonoscopy.   She reports that it is a busy time of the year at work. Continues to work hybrid.  She reports that her husband reports that she snores frequently. Husband has not shared that she has apnea. She plans to talk with dentist about oral appliance at next visit.   Past Psychiatric Medication Trials: Latuda- Adverse reaction- tingling in hands and SOB Cymbalta- helped with anxiety. "Flat"  feeling/affective dulling at 60 mg. Zoloft- heart palpitations Paxil Pristiq Viibryd- Side effects. Stopped after 2 weeks.  Trintellix Wellbutrin - Prescribed but did not take Buspar- took briefly, ineffective. Lamictal Xanax    Review of Systems:  Review of Systems  Musculoskeletal:  Negative for gait problem.  Neurological:  Negative for headaches.  Psychiatric/Behavioral:         Please refer to HPI    Medications: I have reviewed the patient's current medications.  Current Outpatient Medications  Medication Sig Dispense Refill   acetaminophen (TYLENOL) 500 MG tablet Take 1,000 mg by mouth every 6 (six) hours as needed for moderate pain or headache.     ALPRAZolam (XANAX) 0.25 MG tablet Take 1/2-1 tablet three times daily as needed 30 tablet 0   aspirin-acetaminophen-caffeine (EXCEDRIN MIGRAINE) 250-250-65 MG tablet Take by mouth every 6 (six) hours as needed for headache.     cetirizine (ZYRTEC) 10 MG tablet Take 10 mg by mouth daily as needed.     Cholecalciferol (VITAMIN D) 50 MCG (2000 UT) CAPS Take by mouth.     levothyroxine (SYNTHROID) 100 MCG tablet Take 100 mcg by mouth daily.     Misc Natural Products (IMMUNE BOOST PO) Take by mouth.     Multiple Vitamin (MULTIVITAMIN) tablet Take 1 tablet by mouth daily.     Probiotic Product (PROBIOTIC PO) Take by mouth.     buPROPion ER (WELLBUTRIN SR) 100 MG 12 hr tablet Take 1 tablet (100 mg total) by mouth daily. 90 tablet 1  PARoxetine (PAXIL) 20 MG tablet Take 1 tablet (20 mg total) by mouth daily. 90 tablet 1   No current facility-administered medications for this visit.    Medication Side Effects: None  Allergies:  Allergies  Allergen Reactions   Latuda [Lurasidone] Shortness Of Breath    Tingling in hands     Past Medical History:  Diagnosis Date   Anxiety    Hypothyroid    Scoliosis     Past Medical History, Surgical history, Social history, and Family history were reviewed and updated as appropriate.    Please see review of systems for further details on the patient's review from today.   Objective:   Physical Exam:  There were no vitals taken for this visit.  Physical Exam Constitutional:      General: She is not in acute distress. Musculoskeletal:        General: No deformity.  Neurological:     Mental Status: She is alert and oriented to person, place, and time.     Coordination: Coordination normal.  Psychiatric:        Attention and Perception: Attention and perception normal. She does not perceive auditory or visual hallucinations.        Mood and Affect: Mood is not depressed. Affect is not labile, blunt, angry or inappropriate.        Speech: Speech normal.        Behavior: Behavior normal.        Thought Content: Thought content normal. Thought content is not paranoid or delusional. Thought content does not include homicidal or suicidal ideation. Thought content does not include homicidal or suicidal plan.        Cognition and Memory: Cognition and memory normal.        Judgment: Judgment normal.     Comments: Insight intact Mood is mildly anxious     Lab Review:     Component Value Date/Time   NA 137 05/02/2019 1631   K 4.6 05/02/2019 1631   CL 102 05/02/2019 1631   CO2 23 05/02/2019 1631   GLUCOSE 97 05/02/2019 1631   BUN 7 05/02/2019 1631   CREATININE 0.80 05/02/2019 1631   CALCIUM 9.3 05/02/2019 1631   PROT 7.5 05/02/2019 1631   ALBUMIN 4.4 05/02/2019 1631   AST 21 05/02/2019 1631   ALT 26 05/02/2019 1631   ALKPHOS 34 (L) 05/02/2019 1631   BILITOT 0.8 05/02/2019 1631   GFRNONAA >60 05/02/2019 1631   GFRAA >60 05/02/2019 1631       Component Value Date/Time   WBC 19.7 (H) 05/03/2019 0329   RBC 4.93 05/03/2019 0329   HGB 13.9 05/03/2019 0329   HCT 42.7 05/03/2019 0329   PLT 285 05/03/2019 0329   MCV 86.6 05/03/2019 0329   MCH 28.2 05/03/2019 0329   MCHC 32.6 05/03/2019 0329   RDW 12.8 05/03/2019 0329   LYMPHSABS 0.8 05/03/2019 0329    MONOABS 0.1 05/03/2019 0329   EOSABS 0.0 05/03/2019 0329   BASOSABS 0.0 05/03/2019 0329    No results found for: "POCLITH", "LITHIUM"   No results found for: "PHENYTOIN", "PHENOBARB", "VALPROATE", "CBMZ"   .res Assessment: Plan:    30 minutes spent dedicated to the care of this patient on the date of this encounter to include pre-visit review of records, ordering of medication, post visit documentation, and face-to-face time with the patient discussing benefits versus side effects of Paxil 20 mg and 25 mg. She reports that she notices slightly more anxiety with 20 mg  dose, however she reports being able to experience more emotions in general at this dose compared to 25 mg. She reports that she would like to continue Paxil 20 mg daily at this time. Discussed option of increasing back to 25 mg daily if needed, ie. During a time of increased stress.  Discussed using Alprazolam prn to help manage situational anxiety. She reports that she did not start Wellbutrin SR and would like to start trial of Wellbutrin SR 100 mg daily at this time to possibly improve concentration. Pt to follow-up in 6 months or sooner if clinically indicated.  Patient advised to contact office with any questions, adverse effects, or acute worsening in signs and symptoms.   Adalida was seen today for depression and anxiety.  Diagnoses and all orders for this visit:  Generalized anxiety disorder -     PARoxetine (PAXIL) 20 MG tablet; Take 1 tablet (20 mg total) by mouth daily.  Major depressive disorder with single episode, in partial remission (HCC) -     buPROPion ER (WELLBUTRIN SR) 100 MG 12 hr tablet; Take 1 tablet (100 mg total) by mouth daily. -     PARoxetine (PAXIL) 20 MG tablet; Take 1 tablet (20 mg total) by mouth daily.  Obsessive-compulsive disorder, unspecified type -     PARoxetine (PAXIL) 20 MG tablet; Take 1 tablet (20 mg total) by mouth daily.     Please see After Visit Summary for patient specific  instructions.  No future appointments.   No orders of the defined types were placed in this encounter.   -------------------------------

## 2023-05-30 ENCOUNTER — Encounter: Payer: Self-pay | Admitting: Psychiatry

## 2024-01-23 ENCOUNTER — Other Ambulatory Visit: Payer: Self-pay | Admitting: Registered Nurse

## 2024-01-23 DIAGNOSIS — Z1231 Encounter for screening mammogram for malignant neoplasm of breast: Secondary | ICD-10-CM

## 2024-02-01 ENCOUNTER — Telehealth: Payer: Self-pay | Admitting: Psychiatry

## 2024-02-01 DIAGNOSIS — F324 Major depressive disorder, single episode, in partial remission: Secondary | ICD-10-CM

## 2024-02-01 DIAGNOSIS — F411 Generalized anxiety disorder: Secondary | ICD-10-CM

## 2024-02-01 DIAGNOSIS — F429 Obsessive-compulsive disorder, unspecified: Secondary | ICD-10-CM

## 2024-02-01 MED ORDER — PAROXETINE HCL 20 MG PO TABS: 20.0000 mg | ORAL_TABLET | Freq: Every day | ORAL | 0 refills | Status: AC

## 2024-02-01 NOTE — Telephone Encounter (Signed)
Sent 30 day supply

## 2024-02-01 NOTE — Telephone Encounter (Signed)
 Pt called asking for a refill on her paxil  20 mg.  She has an appt with jessica for 02/15/24. [facility] on [address]

## 2024-02-13 ENCOUNTER — Ambulatory Visit
Admission: RE | Admit: 2024-02-13 | Discharge: 2024-02-13 | Disposition: A | Discharge status: Home / Self Care | Source: Ambulatory Visit | Attending: Registered Nurse | Admitting: Registered Nurse

## 2024-02-13 DIAGNOSIS — Z1231 Encounter for screening mammogram for malignant neoplasm of breast: Secondary | ICD-10-CM

## 2024-02-18 ENCOUNTER — Other Ambulatory Visit: Payer: Self-pay | Admitting: Registered Nurse

## 2024-02-18 DIAGNOSIS — R928 Other abnormal and inconclusive findings on diagnostic imaging of breast: Secondary | ICD-10-CM

## 2024-02-20 ENCOUNTER — Ambulatory Visit
Admission: RE | Admit: 2024-02-20 | Discharge: 2024-02-20 | Disposition: A | Discharge status: Home / Self Care | Source: Ambulatory Visit | Attending: Registered Nurse | Admitting: Registered Nurse

## 2024-02-20 DIAGNOSIS — R928 Other abnormal and inconclusive findings on diagnostic imaging of breast: Secondary | ICD-10-CM

## 2024-03-04 ENCOUNTER — Other Ambulatory Visit (HOSPITAL_BASED_OUTPATIENT_CLINIC_OR_DEPARTMENT_OTHER): Payer: Self-pay | Admitting: Registered Nurse

## 2024-03-04 DIAGNOSIS — E039 Hypothyroidism, unspecified: Secondary | ICD-10-CM

## 2024-04-11 ENCOUNTER — Ambulatory Visit (HOSPITAL_BASED_OUTPATIENT_CLINIC_OR_DEPARTMENT_OTHER)
Admission: RE | Admit: 2024-04-11 | Discharge: 2024-04-11 | Disposition: A | Discharge status: Home / Self Care | Payer: Self-pay | Source: Ambulatory Visit | Attending: Registered Nurse | Admitting: Registered Nurse

## 2024-04-11 DIAGNOSIS — E039 Hypothyroidism, unspecified: Secondary | ICD-10-CM | POA: Insufficient documentation
